# Patient Record
Sex: Male | Born: 1970 | Race: White | Hispanic: No | Smoking: Former smoker
Health system: Southern US, Community
[De-identification: ages and names within clinical notes are randomized; demographics above are authoritative.]

## PROBLEM LIST (undated history)

## (undated) DIAGNOSIS — K449 Diaphragmatic hernia without obstruction or gangrene: Secondary | ICD-10-CM

## (undated) DIAGNOSIS — Z860100 Personal history of colon polyps, unspecified: Secondary | ICD-10-CM

## (undated) DIAGNOSIS — K5790 Diverticulosis of intestine, part unspecified, without perforation or abscess without bleeding: Secondary | ICD-10-CM

## (undated) DIAGNOSIS — D509 Iron deficiency anemia, unspecified: Secondary | ICD-10-CM

## (undated) DIAGNOSIS — I10 Essential (primary) hypertension: Secondary | ICD-10-CM

## (undated) DIAGNOSIS — A048 Other specified bacterial intestinal infections: Secondary | ICD-10-CM

## (undated) DIAGNOSIS — I8390 Asymptomatic varicose veins of unspecified lower extremity: Secondary | ICD-10-CM

## (undated) DIAGNOSIS — D126 Benign neoplasm of colon, unspecified: Secondary | ICD-10-CM

## (undated) HISTORY — DX: Other specified bacterial intestinal infections: A04.8

## (undated) HISTORY — DX: Iron deficiency anemia, unspecified: D50.9

## (undated) HISTORY — DX: Diverticulosis of intestine, part unspecified, without perforation or abscess without bleeding: K57.90

## (undated) HISTORY — DX: Benign neoplasm of colon, unspecified: D12.6

## (undated) HISTORY — DX: Personal history of colonic polyps: Z86.010

## (undated) HISTORY — DX: Diaphragmatic hernia without obstruction or gangrene: K44.9

## (undated) HISTORY — PX: VASECTOMY: SHX75

## (undated) HISTORY — DX: Personal history of colon polyps, unspecified: Z86.0100

## (undated) HISTORY — DX: Asymptomatic varicose veins of unspecified lower extremity: I83.90

---

## 2002-11-26 ENCOUNTER — Emergency Department (HOSPITAL_COMMUNITY): Admission: EM | Admit: 2002-11-26 | Discharge: 2002-11-26 | Payer: Self-pay | Admitting: Emergency Medicine

## 2003-04-27 ENCOUNTER — Encounter: Payer: Self-pay | Admitting: Emergency Medicine

## 2003-04-27 ENCOUNTER — Emergency Department (HOSPITAL_COMMUNITY): Admission: EM | Admit: 2003-04-27 | Discharge: 2003-04-28 | Payer: Self-pay | Admitting: Emergency Medicine

## 2003-10-06 ENCOUNTER — Observation Stay (HOSPITAL_COMMUNITY): Admission: EM | Admit: 2003-10-06 | Discharge: 2003-10-07 | Payer: Self-pay | Admitting: Emergency Medicine

## 2003-12-11 ENCOUNTER — Emergency Department (HOSPITAL_COMMUNITY): Admission: AD | Admit: 2003-12-11 | Discharge: 2003-12-11 | Payer: Self-pay | Admitting: Emergency Medicine

## 2006-10-09 ENCOUNTER — Inpatient Hospital Stay (HOSPITAL_COMMUNITY): Admission: EM | Admit: 2006-10-09 | Discharge: 2006-10-11 | Payer: Self-pay | Admitting: Emergency Medicine

## 2010-10-03 ENCOUNTER — Emergency Department (HOSPITAL_BASED_OUTPATIENT_CLINIC_OR_DEPARTMENT_OTHER)
Admission: EM | Admit: 2010-10-03 | Discharge: 2010-10-03 | Payer: Self-pay | Source: Home / Self Care | Admitting: Emergency Medicine

## 2010-10-06 ENCOUNTER — Ambulatory Visit: Payer: Self-pay | Admitting: Internal Medicine

## 2010-10-06 DIAGNOSIS — I8000 Phlebitis and thrombophlebitis of superficial vessels of unspecified lower extremity: Secondary | ICD-10-CM | POA: Insufficient documentation

## 2010-10-06 DIAGNOSIS — I10 Essential (primary) hypertension: Secondary | ICD-10-CM | POA: Insufficient documentation

## 2010-10-06 LAB — CONVERTED CEMR LAB

## 2010-10-11 ENCOUNTER — Ambulatory Visit: Payer: Self-pay | Admitting: Vascular Surgery

## 2010-10-11 ENCOUNTER — Encounter: Payer: Self-pay | Admitting: Internal Medicine

## 2010-11-07 ENCOUNTER — Ambulatory Visit: Admit: 2010-11-07 | Payer: Self-pay | Admitting: Internal Medicine

## 2010-11-18 ENCOUNTER — Encounter: Payer: Self-pay | Admitting: Internal Medicine

## 2010-11-30 NOTE — Assessment & Plan Note (Signed)
Summary: new [facility] referred by Dr Jeanell Sparrow has superficial thrombo...   Vital Signs:  Patient profile:   40 year old male Height:      72.5 inches Weight:      226.75 pounds BMI:     30.44 O2 Sat:      98 % on Room air Temp:     98.3 degrees F oral Pulse rate:   90 / minute Resp:     18 per minute BP sitting:   140 / 90  (right arm) Cuff size:   large  Vitals Entered By: Jiles Garter CMA (October 06, 2010 2:39 PM)  O2 Flow:  Room air CC: New Patient  Comments dicuss left leg pain, varicose veins stripped on Tuesday   Primary Care Jemya Depierro:  Jennings Books DO  CC:  New Patient .  History of Present Illness: 40 y/o white male for ER follow up prev followed Dr. Conley Canal  seen in ER by Dr. Jeanell Sparrow for superficial thrombophlebitis treated Narda Amber vein for varicose veins 2 yrs ago - vein stripping done by Dr. Eilleen Kempf left leg recently became red and swollen  ibuprofen 600 mg - 800 mg prescribed leg is not feeling better still having pain and tenderness of left upper leg  venous doppler completed in ER 12/6 - negative for DVT superficial greater saphenouv vein thrombosis form distal knee to mid thigh  Preventive Screening-Counseling & Management  Alcohol-Tobacco     Alcohol drinks/day: <1     Smoking Status: current     Packs/Day: <0.25     Year Started: 2004  Caffeine-Diet-Exercise     Caffeine use/day: 1 beverage daily     Does Patient Exercise: no      Drug Use:  no.    Allergies (verified): No Known Drug Allergies  Past History:  Past Medical History: Hypertension  Family History: Father had CVA - age 3  Family History Hypertension DM II - mother no colon ca no prostate ca  Social History: Occupation: Interior and spatial designer - [employer] Divorced 2 children - 32 and 31 (shares custody) social smoker Alcohol use-yes Drug use-no Smoking Status:  current Packs/Day:  <0.25 Caffeine use/day:  1 beverage daily Does Patient Exercise:   no Drug Use:  no  Review of Systems  The patient denies fever, chest pain, dyspnea on exertion, prolonged cough, abdominal pain, melena, hematochezia, and severe indigestion/heartburn.         hx of depression  ( lexapro caused agitation )  Physical Exam  General:  alert, well-developed, and well-nourished.   Head:  normocephalic and atraumatic.   Eyes:  pupils equal, pupils round, and pupils reactive to light.   Ears:  R ear normal and L ear normal.   Mouth:  good dentition and pharynx pink and moist.   Neck:  No deformities, masses, or tenderness noted.no carotid bruits.   Lungs:  normal respiratory effort, normal breath sounds, no crackles, and no wheezes.   Heart:  normal rate, regular rhythm, no murmur, and no gallop.   Abdomen:  soft, non-tender, normal bowel sounds, no masses, no hepatomegaly, and no splenomegaly.   Extremities:  trace left pedal edema and trace right pedal edema.   Skin:  tenderness and swelling along left superficial fem vein.  palpable cord Psych:  normally interactive, good eye contact, not anxious appearing, and not depressed appearing.     Impression & Recommendations:  Problem # 1:  SUPERFICIAL THROMBOPHLEBITIS (ICD-451.0) Assessment New pt with  painful superficial thrombophlebitis continue warm compress and aspirin use compression hose arrange follow up with vascular clinic  if symptoms do not improve, consider LMW heparin Orders: T-Basic Metabolic Panel ([id number]) T-Hepatic Function [phone]) T-Lipid Profile [id number]) T-CBC No Diff ([id number]) T-TSH ([id number]) T-Protime, Auto (75301-04045) Vascular Clinic (Vascular)  Problem # 2:  HYPERTENSION (ICD-401.9) previously on micardis.  he stopped medication on his own 3-4 yrs ago restart ARB  His updated medication list for this problem includes:    Losartan Potassium 50 Mg Tabs (Losartan potassium) ..... One by mouth once daily  Orders: T-Basic Metabolic Panel  ([id number]) T-Hepatic Function [id number]) T-Lipid Profile [id number]) T-CBC No Diff ([id number]) T-TSH ([id number]) T-Protime, Auto (25500-16429)  Complete Medication List: 1)  Losartan Potassium 50 Mg Tabs (Losartan potassium) .... One by mouth once daily 2)  Ecotrin 325 Mg Tbec (Aspirin) .... One by mouth once daily  Patient Instructions: 1)  Please schedule a follow-up appointment in 1 month. Prescriptions: LOSARTAN POTASSIUM 50 MG TABS (LOSARTAN POTASSIUM) one by mouth once daily  #30 x 2   Entered and Authorized by:   D. Drema Pry DO   Signed by:   D. Drema Pry DO on 10/06/2010   Method used:   Print then Give to Patient   RxID:   [id number]    Orders Added: 1)  T-Basic Metabolic Panel [[id number]] 2)  T-Hepatic Function [[id number]] 3)  T-Lipid Profile [[id number]] 4)  T-CBC No Diff [[id number]] 5)  T-TSH [30746-00298] 6)  T-Protime, Auto [47308-56943] 7)  Vascular Clinic [Vascular] 8)  New Patient Level III [99203]   Immunization History:  Influenza Immunization History:    Influenza:  declined (10/06/2010)   Contraindications/Deferment of Procedures/Staging:    Test/Procedure: PSA    Reason for deferment: patient declined   Immunization History:  Influenza Immunization History:    Influenza:  Declined (10/06/2010)   Orders Added: 1)  T-Basic Metabolic Panel [[id number]] 2)  T-Hepatic Function [[id number]] 3)  T-Lipid Profile [[id number]] 4)  T-CBC No Diff [85027-10000] 5)  T-TSH [43014-84039] 6)  T-Protime, Auto [79536-92230] 7)  Vascular Clinic [Vascular] 8)  New Patient Level III [09794]   Current Allergies (reviewed today): No known allergies

## 2010-11-30 NOTE — Consult Note (Signed)
Summary: [facility]  [facility] [place]   Imported By: Edmonia James 10/20/2010 09:00:44  _____________________________________________________________________  External Attachment:    Type:   Image     Comment:   External Document

## 2011-03-13 NOTE — Consult Note (Signed)
NEW PATIENT CONSULTATION   [name], Martin L  DOB:  [birth year]                                       10/11/2010  EUMPN#:[id number]   The patient presents today for evaluation of superficial  thrombophlebitis in his left thigh.  He is an otherwise healthy 40-year-  old gentleman who had a prior treatment at an outlying vein center.  On  discussing with the patient, it sounds as though he had an ablation of  the anterior saphenous branch in his left thigh.  He 2 weeks ago had an  episode of pain and erythema over this area and was seen at an outlying  ED and was appropriately diagnosed with superficial thrombophlebitis.  He had an ultrasound at that time showing no evidence of DVT.  He is  seeing me for further evaluation.  He does not have any other history of  clotting disorder.   PHYSICAL EXAMINATION:  Well-developed, well-nourished white male  appearing stated age, in no acute distress.  Blood pressure is 150/118,  heart rate is 80, respirations 20.  He has 2+ dorsalis pedis  bilaterally.  He has extensive clot by physical exam beginning in his  proximal thigh extending up over his anterior thigh towards the  saphenofemoral junction.  I imaged this with ultrasound, and this does  show clot from the proximal calf through this anterior branch all the  way to just below the saphenofemoral junction.  He does have a small  caliber saphenous vein, with this anterior branch coming close to the  fascia.   I discussed this with the patient.  I explained that this does not have  put him at any increased risk for DVT.  I suspect that he did have  recanalization of this anterior branch which was in all likelihood  treated with laser ablation and had recanalization.  I explained that as  he resolves this clot that he may sclerose this vein and not require any  additional treatment.  I also explained that when he resolves the clot  in this surface vein that he may have a  recurrent large varices that may  require additional treatment, either ablation or phlebectomy.  He will  continue his ibuprofen, elevation and heat and will see [place] again in  several months he has recurrent problems.  Otherwise he will see [place] on  an as-needed basis.     Rosetta Posner, M.D.  Electronically Signed   TFE/MEDQ  D:  10/11/2010  T:  10/12/2010  Job:  [id number]   cc:   Sandy Salaam. Shawna Orleans, DO
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
## 2011-03-16 NOTE — Procedures (Signed)
EEG NUMBER:  [id number]   HISTORY:  This is a 40 year old with excessive sleepiness, lethargy and  disorientation and is having an EEG done to evaluate for seizure  activity.   PROCEDURE:  This is a routine EEG technical description.  Throughout  this routine EEG, there is a posterior dominant rhythm of 9-10 Hz  activity at 10-20 microvolts of background activity, symmetric, mostly  comprised of alpha and theta range activity and 10-30 microvolts.  The  patient is extremely drowsy throughout the beginning of the tracing and  eventually falls asleep during the recording.  There is occasional EMG  movement artifact that occasionally obscures the background.  With  photic stimulation, there is symmetric photic driving response.  Hyperventilation does not produce any significant abnormalities.  Throughout this record, there is no evidence of electrographic seizures  roll or interictal discharge activity.   IMPRESSION:  This routine EEG is within normal limits in the awake and  sleep states.      Martin [name]. Estella Husk, M.D.  Electronically Signed     LTK:CXWN  D:  10/10/2006 14:22:44  T:  10/10/2006 21:57:26  Job #:  [id number]
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
## 2011-03-16 NOTE — Discharge Summary (Signed)
NAMENAVY, BELAY NO.:  [id number]   MEDICAL RECORD NO.:  [id number]          PATIENT TYPE:  INP   LOCATION:  7494                         FACILITY:  [facility]   PHYSICIAN:  Cyril Mourning, D.O.    DATE OF BIRTH:  04/19/1971   DATE OF ADMISSION:  10/09/2006  DATE OF DISCHARGE:  10/11/2006                               DISCHARGE SUMMARY   PRIMARY CARE PHYSICIAN:  Dr. Harley Alto.   FINAL DIAGNOSIS:  Vertigo.   ADDITIONAL DIAGNOSIS:  Elevated LDL of 127.   CONSULTATIONS THIS HOSPITALIZATION:  Dr. Morene Antu of neurology.   PROCEDURES PERFORMED THIS ADMISSION:  MRI of the brain, as well as MRA  of the neck.  No significant abnormality had bee detected, though the  images were motion degraded.  There was only some mild narrowing and  irregularity in the proximal right vertebral artery.  The left vertebral  artery is a dominant vertebral artery.  There was no evidence of  hemodynamically significant stenosis involving either carotid  bifurcation.  His head CT revealed.  His EEG findings were normal.  Lab  analysis revealed normal homocystine.  Lipid profile revealed an LDL of  127.  TSH was low at 0.337 and is recommended to have this repeated in  the outpatient setting.  Hemoglobin A1c is 5.3.  BUN is 5, creatinine is  0.8.   MEDICATIONS ON DISCHARGE:  No prescriptions were provided.   He was instructed to follow up with Dr. Conley Canal in one to two weeks and  he was instructed to undergo outpatient physical therapy for vestibular  rehabilitation, as well as undergo a sleep study through a set primary  care physician for daytime hypersomnolence.   HISTORY OF PRESENT ILLNESS:  For full details, please refer to the H&P,  as dictated by Dr. Maia Petties, however briefly, Mr. Hersh is a 40 year old  male with a history of depression and hypertension who was in his usual  state of health until around 8:30 a.m. when his girlfriend went to visit  and found him  confused and difficult arouse.  He was not able to  comprehend the things that she had told.  He was quite lethargic.  Complained of some tingling all over.  The patient stated that he felt  as though his tongue was swollen.  His girlfriend called EMS.  He was a  little tachycardiac on EMS arrival.  His blood glucose was 139.  In the  event underwent neurology consultation in the emergency department and  was admitted for further workup.   HOSPITAL COURSE:  Mr. Boorman's course was unremarkable.  His symptoms  did resolve.  There were no acute findings on the workup and EEG, as  well as MRI and MRA were normal.  His LDL was slightly elevated.  We  employed the assistance of physical therapy for vestibular training and  this is being recommended to continue in the outpatient setting.      Cyril Mourning, D.O.  Electronically Signed     ESS/MEDQ  D:  12/16/2006  T:  12/17/2006  Job:  [id number]   cc:   Theodoro Parma. Francina Ames., M.D.
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
## 2011-07-01 ENCOUNTER — Encounter: Payer: Self-pay | Admitting: *Deleted

## 2011-07-01 ENCOUNTER — Emergency Department (INDEPENDENT_AMBULATORY_CARE_PROVIDER_SITE_OTHER): Payer: Self-pay

## 2011-07-01 ENCOUNTER — Emergency Department (HOSPITAL_BASED_OUTPATIENT_CLINIC_OR_DEPARTMENT_OTHER)
Admission: EM | Admit: 2011-07-01 | Discharge: 2011-07-01 | Disposition: A | Discharge status: Home / Self Care | Payer: Self-pay | Attending: Emergency Medicine | Admitting: Emergency Medicine

## 2011-07-01 DIAGNOSIS — W2209XA Striking against other stationary object, initial encounter: Secondary | ICD-10-CM | POA: Insufficient documentation

## 2011-07-01 DIAGNOSIS — M79609 Pain in unspecified limb: Secondary | ICD-10-CM

## 2011-07-01 DIAGNOSIS — F17200 Nicotine dependence, unspecified, uncomplicated: Secondary | ICD-10-CM | POA: Insufficient documentation

## 2011-07-01 DIAGNOSIS — S63639A Sprain of interphalangeal joint of unspecified finger, initial encounter: Secondary | ICD-10-CM | POA: Insufficient documentation

## 2011-07-01 DIAGNOSIS — M25549 Pain in joints of unspecified hand: Secondary | ICD-10-CM

## 2011-07-01 DIAGNOSIS — R609 Edema, unspecified: Secondary | ICD-10-CM
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
## 2011-07-02 NOTE — ED Provider Notes (Signed)
Medical screening examination/treatment/procedure(s) were performed by non-physician practitioner and as supervising physician I was immediately available for consultation/collaboration.  Babette Relic, MD 07/02/11 1328
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
## 2013-02-07 ENCOUNTER — Encounter (HOSPITAL_BASED_OUTPATIENT_CLINIC_OR_DEPARTMENT_OTHER): Payer: Self-pay

## 2013-02-07 ENCOUNTER — Emergency Department (HOSPITAL_BASED_OUTPATIENT_CLINIC_OR_DEPARTMENT_OTHER): Payer: BC Managed Care – PPO

## 2013-02-07 ENCOUNTER — Emergency Department (HOSPITAL_BASED_OUTPATIENT_CLINIC_OR_DEPARTMENT_OTHER)
Admission: EM | Admit: 2013-02-07 | Discharge: 2013-02-07 | Disposition: A | Discharge status: Home / Self Care | Payer: BC Managed Care – PPO | Attending: Emergency Medicine | Admitting: Emergency Medicine

## 2013-02-07 DIAGNOSIS — F17200 Nicotine dependence, unspecified, uncomplicated: Secondary | ICD-10-CM | POA: Insufficient documentation

## 2013-02-07 DIAGNOSIS — M2540 Effusion, unspecified joint: Secondary | ICD-10-CM | POA: Insufficient documentation

## 2013-02-07 DIAGNOSIS — M5431 Sciatica, right side: Secondary | ICD-10-CM

## 2013-02-07 DIAGNOSIS — M5430 Sciatica, unspecified side: Secondary | ICD-10-CM | POA: Insufficient documentation

## 2013-02-07 DIAGNOSIS — M549 Dorsalgia, unspecified: Secondary | ICD-10-CM | POA: Insufficient documentation

## 2013-02-07 DIAGNOSIS — Z79899 Other long term (current) drug therapy: Secondary | ICD-10-CM | POA: Insufficient documentation

## 2013-02-07 MED ORDER — PREDNISONE 10 MG PO TABS: ORAL_TABLET | ORAL | Status: DC

## 2013-02-07 MED ORDER — OXYCODONE-ACETAMINOPHEN 5-325 MG PO TABS: 2.0000 | ORAL_TABLET | ORAL | Status: DC | PRN

## 2013-02-07 MED ORDER — OXYCODONE-ACETAMINOPHEN 5-325 MG PO TABS
2.0000 | ORAL_TABLET | Freq: Once | ORAL | Status: AC
  Administered 2013-02-07: 2 via ORAL
  Filled 2013-02-07 (×2): qty 2

## 2013-02-07 NOTE — ED Provider Notes (Signed)
Medical screening examination/treatment/procedure(s) were performed by non-physician practitioner and as supervising physician I was immediately available for consultation/collaboration.   Saddie Benders. Letishia Elliott, MD 02/07/13 1539

## 2013-02-07 NOTE — ED Notes (Signed)
Pt states that he had osnet of L hip pain last night, denies any n/v, fever, urinary symptoms.  Pain incr with movement.  Pt states that pain is in the L buttock and radiating down the the L lateral thigh.

## 2013-02-07 NOTE — ED Provider Notes (Signed)
History     CSN: [id number]  Arrival date & time 02/07/13  61   First MD Initiated Contact with Patient 02/07/13 1349      Chief Complaint  Patient presents with  . Hip Pain    (Consider location/radiation/quality/duration/timing/severity/associated sxs/prior treatment) Patient is a 42 y.o. male presenting with hip pain. The history is provided by the patient. No language interpreter was used.  Hip Pain This is a new problem. The current episode started yesterday. The problem occurs constantly. The problem has been gradually worsening. Associated symptoms include joint swelling. Nothing aggravates the symptoms. He has tried nothing for the symptoms.  Pt reports sudden severe pain  Left hip and back.   Pt reports pain with walking,  Pain with moving hip and back.   Pt has a history of a ruptured disc l5-s1  History reviewed. No pertinent past medical history.  Past Surgical History  Procedure Laterality Date  . Vasectomy      History reviewed. No pertinent family history.  History  Substance Use Topics  . Smoking status: Current Every Day Smoker -- 0.50 packs/day for 4 years    Types: Cigarettes  . Smokeless tobacco: Never Used  . Alcohol Use: 8.4 oz/week    14 Glasses of wine per week      Review of Systems  Musculoskeletal: Positive for back pain and joint swelling.  All other systems reviewed and are negative.    Allergies  Review of patient's allergies indicates no known allergies.  Home Medications   Current Outpatient Rx  Name  Route  Sig  Dispense  Refill  . acetaminophen (TYLENOL) 500 MG tablet   Oral   Take 1,000 mg by mouth every 6 (six) hours as needed for pain.         . methocarbamol (ROBAXIN) 500 MG tablet   Oral   Take 500 mg by mouth once.         Marland Kitchen UNKNOWN TO PATIENT      Pt took a family members narcotic pain medication this morning.  One tablet only.         . Tetrahydrozoline HCl (VISINE OP)   Both Eyes   Place 1 drop into  both eyes daily as needed. Dry eyes          . vitamin B-12 (CYANOCOBALAMIN) 1000 MCG tablet   Oral   Take 1,000 mcg by mouth daily.             BP 144/95  Pulse 95  Temp(Src) 99 F (37.2 C) (Oral)  Resp 20  Ht 6' 2"  (1.88 m)  Wt 229 lb (103.874 kg)  BMI 29.39 kg/m2  SpO2 98%  Physical Exam  Nursing note and vitals reviewed. Constitutional: He is oriented to person, place, and time. He appears well-developed.  Cardiovascular: Normal rate.   Pulmonary/Chest: Effort normal.  Abdominal: Soft.  Musculoskeletal: He exhibits tenderness.  Tender left hip,  Tender sciatic notch,   Pain with range of motion,   Dtrs equal bilat    Pain in hip with figure 4    Neurological: He is alert and oriented to person, place, and time. He has normal reflexes.    ED Course  Procedures (including critical care time)  Labs Reviewed - No data to display Dg Hip Complete Left  02/07/2013  *RADIOLOGY REPORT*  Clinical Data: Left hip pain.  LEFT HIP - COMPLETE 2+ VIEW  Comparison: None.  Findings: AP view of the pelvis and two views  of the left hip were obtained.  Calcification in the left hemipelvis is suggestive for a phlebolith.  The pelvic bony ring is intact.  Symmetric appearance of the sacroiliac joints.  Nonspecific bowel gas pattern.  Left hip is located without acute fracture.  IMPRESSION: No acute bony abnormality in the pelvis or left hip.   Original Report Authenticated By: Markus Daft, M.D.      No diagnosis found.    MDM  aso left   crutches        Fransico Meadow, [place] 02/07/13 1537

## 2013-02-10 NOTE — ED Notes (Signed)
Pt called and requested a note for 4/14 -16/2014 for work.

## 2013-09-25 IMAGING — CR DG HIP (WITH OR WITHOUT PELVIS) 2-3V*L*
3 series · 3 of 3 positions shown · non-contrast
Comparison: None.

CLINICAL DATA: Left hip pain.

LEFT HIP - COMPLETE 2+ VIEW

[t pelvis a.p.]
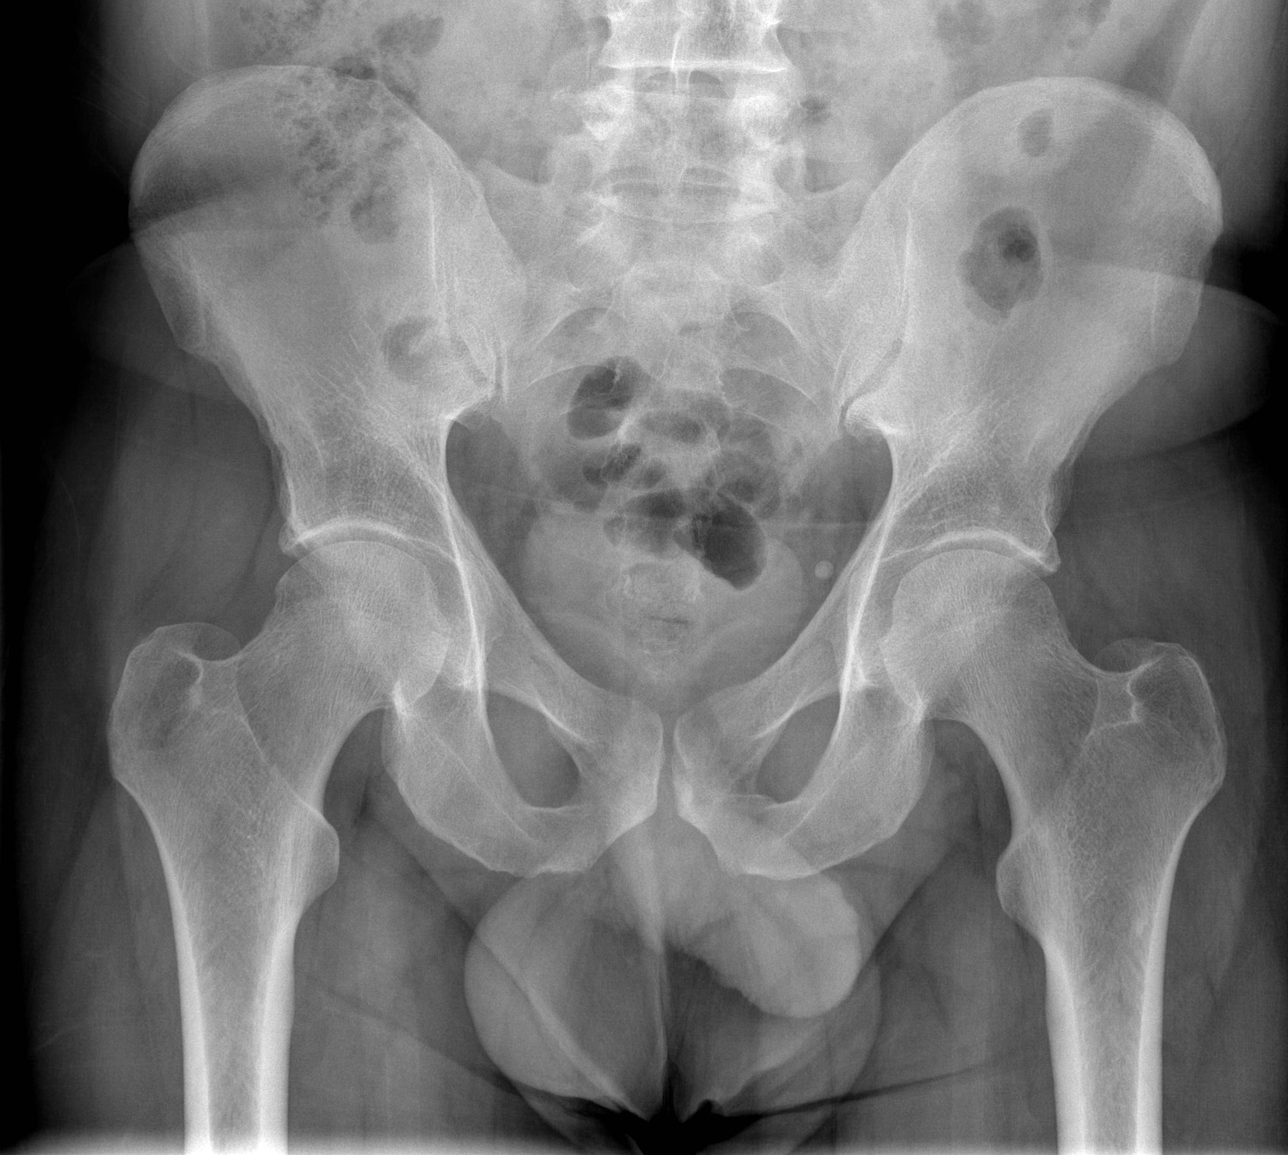

[t hip ap left]
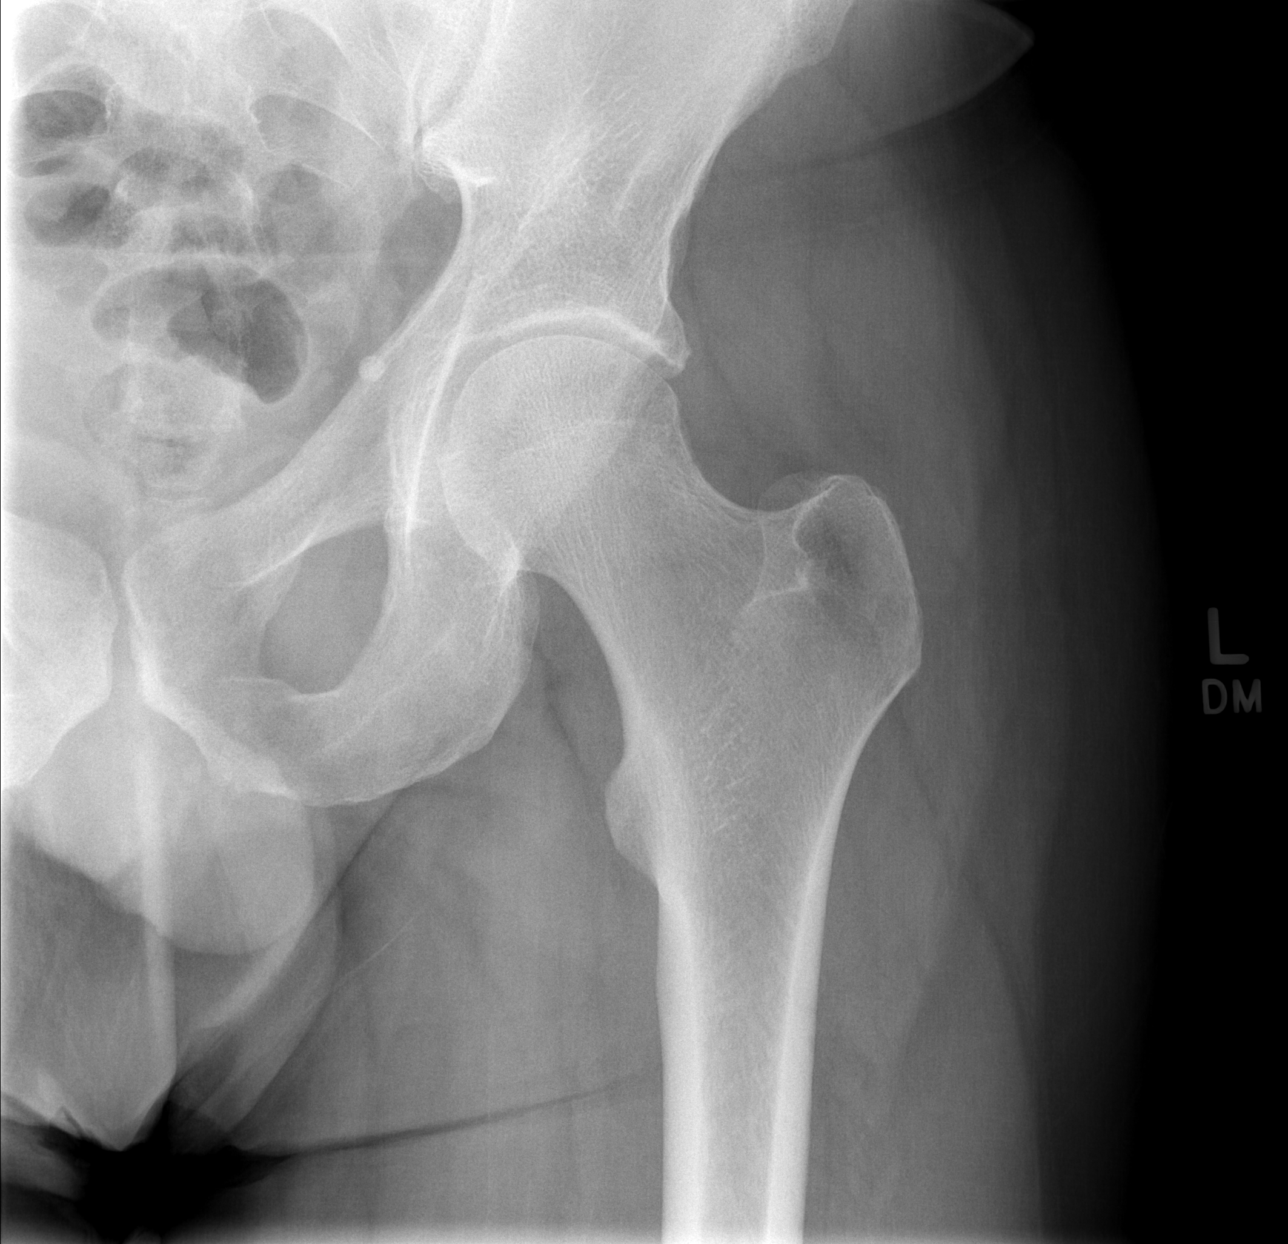

[t hip frog leg left]
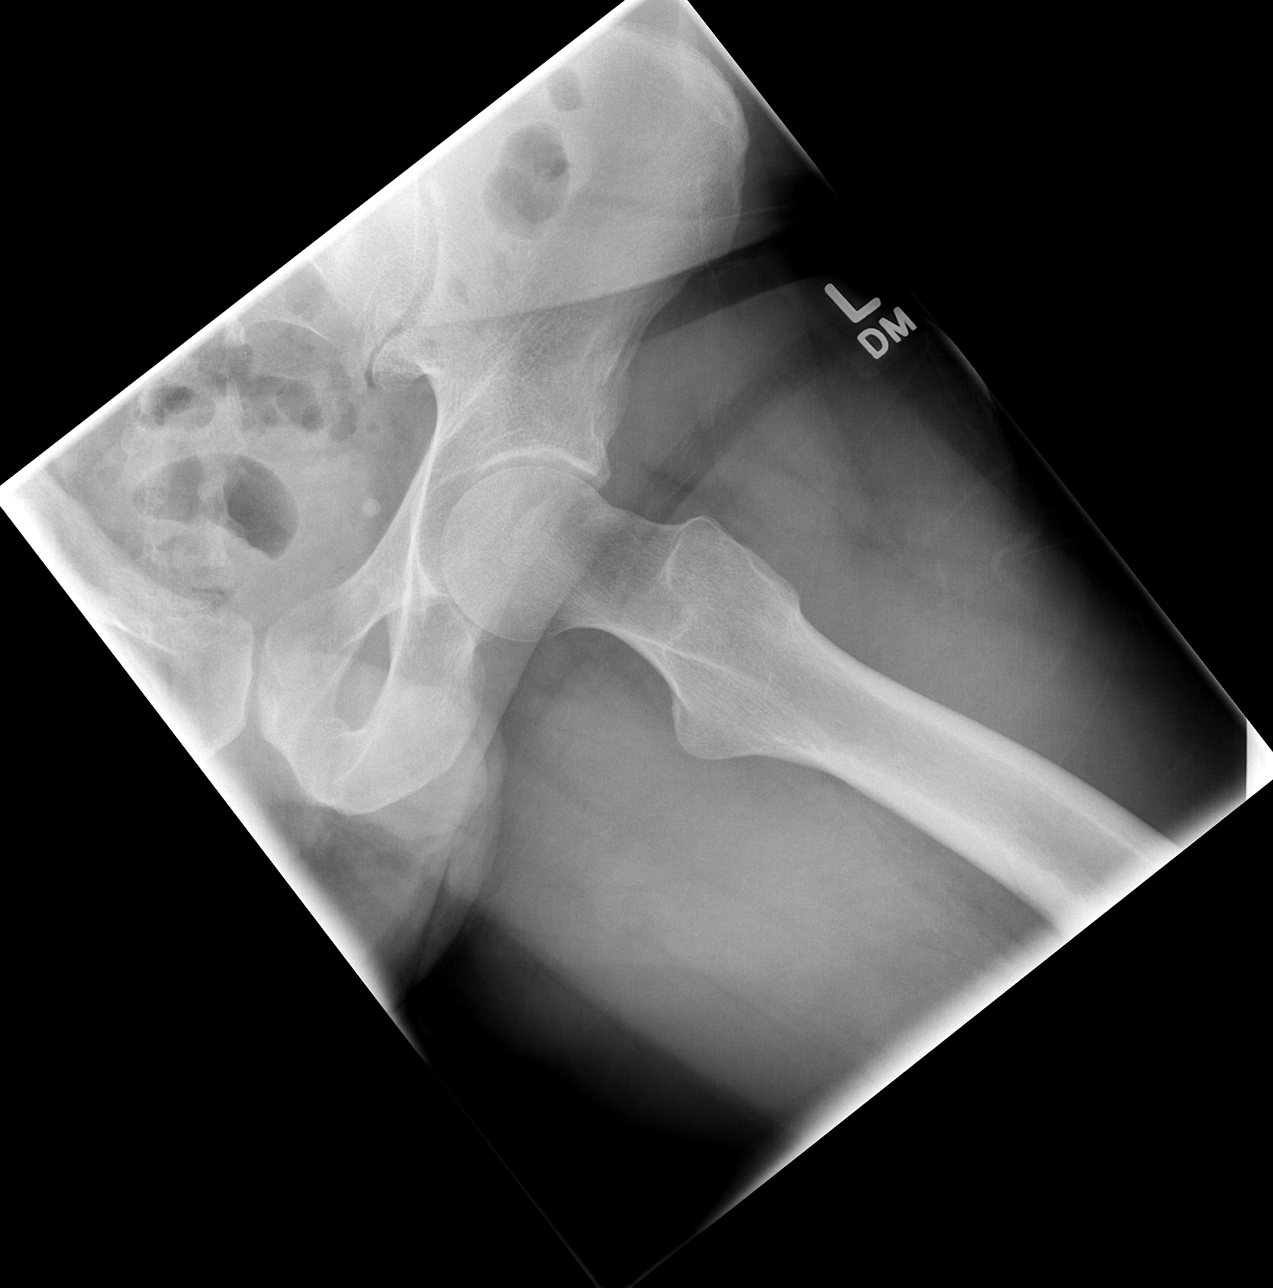

[3 of 3 positions shown; findings below may reference images not displayed]

FINDINGS: AP view of the pelvis and two views of the left hip were
obtained.  Calcification in the left hemipelvis is suggestive for a
phlebolith.  The pelvic bony ring is intact.  Symmetric appearance
of the sacroiliac joints.  Nonspecific bowel gas pattern.  Left hip
is located without acute fracture.
IMPRESSION: No acute bony abnormality in the pelvis or left hip.

## 2014-10-29 DIAGNOSIS — I8390 Asymptomatic varicose veins of unspecified lower extremity: Secondary | ICD-10-CM

## 2014-10-29 HISTORY — DX: Asymptomatic varicose veins of unspecified lower extremity: I83.90

## 2015-04-14 ENCOUNTER — Telehealth: Payer: Self-pay

## 2015-04-14 DIAGNOSIS — Z8739 Personal history of other diseases of the musculoskeletal system and connective tissue: Secondary | ICD-10-CM

## 2015-04-14 DIAGNOSIS — I8390 Asymptomatic varicose veins of unspecified lower extremity: Secondary | ICD-10-CM

## 2015-04-14 DIAGNOSIS — M7989 Other specified soft tissue disorders: Secondary | ICD-10-CM

## 2015-04-14 NOTE — Telephone Encounter (Signed)
Phone call from pt.  Stated he would like an appt. To have left leg evaluated.  C/o swelling of left ankle and shin area.  Reported there is a burning sensation in the left groin and upper thigh, and in the (L) calf and inner ankle.  Also c/o bulging varicose veins in (L) upper thigh / groin area, and (L) calf.  C/o tingling and numbness from knee and down the lower leg.  Denied any open sores.  Reported he had a laser procedure done on the left leg, at the knee, at another outpatient vein center in the past, and does not want to go back there. Advised will call pt. back to schedule an appt.

## 2015-04-15 ENCOUNTER — Telehealth: Payer: Self-pay | Admitting: Vascular Surgery

## 2015-04-15 NOTE — Telephone Encounter (Signed)
notified patient of appt. with dr. Scot Dock on 04-29-15 at 11:30 for lab and then 12-:30 to see md

## 2015-04-27 ENCOUNTER — Encounter: Payer: Self-pay | Admitting: Vascular Surgery

## 2015-04-29 ENCOUNTER — Ambulatory Visit (HOSPITAL_COMMUNITY)
Admission: RE | Admit: 2015-04-29 | Discharge: 2015-04-29 | Disposition: A | Discharge status: Home / Self Care | Payer: BLUE CROSS/BLUE SHIELD | Source: Ambulatory Visit | Attending: Vascular Surgery | Admitting: Vascular Surgery

## 2015-04-29 ENCOUNTER — Ambulatory Visit (INDEPENDENT_AMBULATORY_CARE_PROVIDER_SITE_OTHER): Payer: BLUE CROSS/BLUE SHIELD | Admitting: Vascular Surgery

## 2015-04-29 ENCOUNTER — Encounter: Payer: Self-pay | Admitting: Vascular Surgery

## 2015-04-29 VITALS — BP 134/88 | HR 66 | Resp 16 | Ht 74.0 in | Wt 239.0 lb

## 2015-04-29 DIAGNOSIS — M7989 Other specified soft tissue disorders: Secondary | ICD-10-CM | POA: Diagnosis not present

## 2015-04-29 DIAGNOSIS — Z8739 Personal history of other diseases of the musculoskeletal system and connective tissue: Secondary | ICD-10-CM

## 2015-04-29 DIAGNOSIS — I868 Varicose veins of other specified sites: Secondary | ICD-10-CM | POA: Insufficient documentation

## 2015-04-29 DIAGNOSIS — I8390 Asymptomatic varicose veins of unspecified lower extremity: Secondary | ICD-10-CM

## 2015-04-29 DIAGNOSIS — I872 Venous insufficiency (chronic) (peripheral): Secondary | ICD-10-CM

## 2015-04-29 DIAGNOSIS — Z87898 Personal history of other specified conditions: Secondary | ICD-10-CM | POA: Insufficient documentation

## 2015-04-29 NOTE — Progress Notes (Signed)
[facility] [place]  Patient name: Martin [name] MRN: [id number] DOB: Apr 21, 1971 Sex: male  REASON FOR VISIT: Left leg pain and swelling with a history of chronic venous insufficiency.  HPI: Martin [name] is a 44 y.o. male who underwent laser ablation of the left greater saphenous vein by [place] [facility] in 2010. He was seen in our office by Dr. Donnetta Hutching in December 2011. He has a history of varicose veins of the left lower extremity and over the last several months these have become significantly symptomatic. He experiences significant aching pain and swelling in the left leg. The symptoms are aggravated by standing and relieved somewhat with elevation. He denies any previous history of DVT or phlebitis. He works at [employer] and is walking and moving around most of the day but is on his feet for fairly long days.  His medical history is otherwise fairly unremarkable. He denies any history of diabetes, hypertension, hypercholesterolemia, or history of cardiac disease.   Past Medical History  Diagnosis Date  . Varicose veins 2016    Left Leg   Family History  Problem Relation Age of Onset  . Diabetes Mother   . COPD Father   . Diabetes Father   . Hypertension Father   . Hypertension Brother    SOCIAL HISTORY: History  Substance Use Topics  . Smoking status: Former Smoker -- 0.50 packs/day for 4 years    Types: Cigarettes    Quit date: 04/28/2012  . Smokeless tobacco: Never Used  . Alcohol Use: 8.4 oz/week    14 Glasses of wine per week   No Known Allergies Current Outpatient Prescriptions  Medication Sig Dispense Refill  . acetaminophen (TYLENOL) 500 MG tablet Take 1,000 mg by mouth every 6 (six) hours as needed for pain.    . methocarbamol (ROBAXIN) 500 MG tablet Take 500 mg by mouth once.    Marland Kitchen oxyCODONE-acetaminophen (PERCOCET/ROXICET) 5-325 MG per tablet Take 2 tablets by mouth every 4 (four) hours as needed for pain. (Patient taking  differently: Take 2 tablets by mouth as needed. Back) 15 tablet 0  . Tetrahydrozoline HCl (VISINE OP) Place 1 drop into both eyes daily as needed. Dry eyes     . UNKNOWN TO PATIENT Pt took a family members narcotic pain medication this morning.  One tablet only.    . vitamin B-12 (CYANOCOBALAMIN) 1000 MCG tablet Take 1,000 mcg by mouth daily.      . predniSONE (DELTASONE) 10 MG tablet 6,5,4,3,2,1 taper (Patient not taking: Reported on 04/29/2015) 21 tablet 0   No current facility-administered medications for this visit.   REVIEW OF SYSTEMS: Valu.Nieves ] denotes positive finding; [  ] denotes negative finding  CARDIOVASCULAR:  [ ]  chest pain   [ ]  chest pressure   [ ]  palpitations   [ ]  orthopnea   [ ]  dyspnea on exertion   [ ]  claudication   [ ]  rest pain   [ ]  DVT   [ ]  phlebitis PULMONARY:   [ ]  productive cough   [ ]  asthma   [ ]  wheezing NEUROLOGIC:   [ ]  weakness  Valu.Nieves ] paresthesias left leg [ ]  aphasia  [ ]  amaurosis  [ ]  dizziness HEMATOLOGIC:   [ ]  bleeding problems   [ ]  clotting disorders MUSCULOSKELETAL:  [ ]  joint pain   [ ]  joint swelling Valu.Nieves ] leg swelling GASTROINTESTINAL: [ ]   blood in stool  [ ]   hematemesis GENITOURINARY:  [ ]   dysuria  [ ]   hematuria PSYCHIATRIC:  [ ]  history of major depression INTEGUMENTARY:  [ ]  rashes  [ ]  ulcers CONSTITUTIONAL:  [ ]  fever   [ ]  chills  PHYSICAL EXAM: Filed Vitals:   04/29/15 1220  BP: 134/88  Pulse: 66  Resp: 16  Height: 6' 2"  (1.88 m)  Weight: 239 lb (108.41 kg)  SpO2: 94%   GENERAL: The patient is a well-nourished male, in no acute distress. The vital signs are documented above. CARDIOVASCULAR: There is a regular rate and rhythm. I do not detect carotid bruits. He has palpable dorsalis pedis pulses. PULMONARY: There is good air exchange bilaterally without wheezing or rales. ABDOMEN: Soft and non-tender with normal pitched bowel sounds.  MUSCULOSKELETAL: There are no major deformities or cyanosis. NEUROLOGIC: No focal weakness or  paresthesias are detected. SKIN: There are no ulcers or rashes noted. He has some large truncal varicosities along the anterior medial aspect of his left thigh and also the medial aspect of his left calf. PSYCHIATRIC: The patient has a normal affect.  DATA:  I have independently interpreted his venous duplex of the left lower extremity. There is no evidence of DVT in the left lower extremity. There is reflux in the deep system involving the common femoral vein and popliteal vein. There is an accessory branch of the left great saphenous vein that is dilated and tortuous.  There is also a short segment of reflux in the small saphenous vein in the left calf.  MEDICAL ISSUES:  CHRONIC VENOUS INSUFFICIENCY: this patient has significant symptoms from his varicose veins of the left lower extremity. He has significant aching and swelling which has come progressively worse over the last several months. He now has some paresthesias in the left foot. The accessory branch of the left great saphenous vein is very tortuous in this might potentially make it challenging to consider laser ablation of this vein. However we will review the study with Dr. Donnetta Hutching. In addition we have discussed the importance of intermittent leg elevation and proper positioning for this. I also written him a prescription for knee-high compression stockings with a gradient of 15-20 mmHg. He might need to be considered for 5 stockings with a 20-30 mmHg gradient although today he was hasn't to consider thigh-high stockings. I've encouraged him to avoid prolonged sitting and standing. I have encouraged him to exercise as much as possible and to consider water aerobics. We will see him back prn.  Deitra Mayo [facility] [place]: [phone]

## 2017-10-16 ENCOUNTER — Encounter: Payer: Self-pay | Admitting: Gastroenterology

## 2017-11-06 ENCOUNTER — Encounter (INDEPENDENT_AMBULATORY_CARE_PROVIDER_SITE_OTHER): Payer: Self-pay

## 2017-11-06 ENCOUNTER — Ambulatory Visit: Payer: Managed Care, Other (non HMO) | Admitting: Gastroenterology

## 2017-11-06 ENCOUNTER — Encounter: Payer: Self-pay | Admitting: Gastroenterology

## 2017-11-06 VITALS — BP 176/118 | HR 83 | Ht 74.0 in | Wt 251.0 lb

## 2017-11-06 DIAGNOSIS — K625 Hemorrhage of anus and rectum: Secondary | ICD-10-CM | POA: Insufficient documentation

## 2017-11-06 DIAGNOSIS — D509 Iron deficiency anemia, unspecified: Secondary | ICD-10-CM | POA: Diagnosis not present

## 2017-11-06 DIAGNOSIS — K219 Gastro-esophageal reflux disease without esophagitis: Secondary | ICD-10-CM | POA: Diagnosis not present

## 2017-11-06 DIAGNOSIS — R1032 Left lower quadrant pain: Secondary | ICD-10-CM

## 2017-11-06 MED ORDER — PEG-KCL-NACL-NASULF-NA ASC-C 140 G PO SOLR: 1.0000 | ORAL | 0 refills | Status: DC

## 2017-11-06 NOTE — Progress Notes (Addendum)
11/06/2017 Martin [name] [id number] 03/19/1971   HISTORY OF PRESENT ILLNESS:  This is a 47 year old male who is new to our office and is here at the request of his PCP, Dr. Maudie Mercury, for mild iron deficiency anemia.  Hemoglobin reportedly low at 11.8 g, but the labs that we have show 13.1 g.  His ferritin is low at 14, but other iron studies are within normal limits.  Vitamin B12 and folate normal.  He had an episode of rectal bleeding back in November, which prompted his visit with his PCP and these labs to be performed.  That has not recurred since that time.  He denies any major issues with moving his bowels, occasional constipation from his pain medication.  Is seeing a surgery for his back later this month.  Reports intermittent acid reflux and left upper quadrant abdominal pain, although no pain now, and reports that it is nothing severe.  Take OTC zantac a couple of times per week.  Denies NSAID use.  Apparently he had celiac labs drawn and Hemoccult cards performed, but we do not have those results and have requested them.     Past Medical History:  Diagnosis Date  . Varicose veins 2016   Left Leg   Past Surgical History:  Procedure Laterality Date  . VASECTOMY      reports that he quit smoking about 5 years ago. His smoking use included cigarettes. He has a 2.00 pack-year smoking history. he has never used smokeless tobacco. He reports that he drinks about 8.4 oz of alcohol per week. He reports that he does not use drugs. family history includes COPD in his father; Diabetes in his father and mother; Hypertension in his brother and father. No Known Allergies    Outpatient Encounter Medications as of 11/06/2017  Medication Sig  . oxyCODONE-acetaminophen (PERCOCET/ROXICET) 5-325 MG per tablet Take 2 tablets by mouth every 4 (four) hours as needed for pain. (Patient taking differently: Take 2 tablets by mouth as needed. Back)  . acetaminophen (TYLENOL) 500 MG tablet Take 1,000 mg by  mouth every 6 (six) hours as needed for pain.  Marland Kitchen PEG-KCl-NaCl-NaSulf-Na Asc-C (PLENVU) 140 g SOLR Take 1 kit by mouth as directed.  . vitamin B-12 (CYANOCOBALAMIN) 1000 MCG tablet Take 1,000 mcg by mouth daily.    . [DISCONTINUED] methocarbamol (ROBAXIN) 500 MG tablet Take 500 mg by mouth once.  . [DISCONTINUED] predniSONE (DELTASONE) 10 MG tablet 6,5,4,3,2,1 taper (Patient not taking: Reported on 04/29/2015)  . [DISCONTINUED] Tetrahydrozoline HCl (VISINE OP) Place 1 drop into both eyes daily as needed. Dry eyes   . [DISCONTINUED] UNKNOWN TO PATIENT Pt took a family members narcotic pain medication this morning.  One tablet only.   No facility-administered encounter medications on file as of 11/06/2017.      REVIEW OF SYSTEMS  : All other systems reviewed and negative except where noted in the History of Present Illness.   PHYSICAL EXAM: BP (!) 176/118   Pulse 83   Ht 6' 2"  (1.88 m)   Wt 251 lb (113.9 kg)   BMI 32.23 kg/m  General: Well developed white male in no acute distress Head: Normocephalic and atraumatic Eyes:  Sclerae anicteric, conjunctiv pink. Ears: Normal auditory acuity Neck: Supple, no masses.  Lungs: Clear throughout to auscultation Heart: Regular rate and rhythm Abdomen: Soft, non-distended. Normal bowel sounds.  Non-tender. Rectal:  Will be done at the time of colonoscopy. Musculoskeletal: Symmetrical with no gross deformities  Skin: No lesions  on visible extremities Extremities: No edema  Neurological: Alert oriented x 4, grossly non-focal Psychological:  Alert and cooperative. Normal mood and affect  ASSESSMENT AND PLAN: *47 year old male here for mild iron deficiency anemia.  Hemoglobin reportedly low at 11.8 g, but the labs that we have show 13.1 g.  His ferritin is low at 14, but other iron studies are within normal limits.  Vitamin B12 and folate normal.  He had an episode of rectal bleeding back in November, which prompted his visit and these labs to be  performed.  That has not recurred since that time.  He is within age of new screening colonoscopy guidelines.  Reports intermittent acid reflux and left upper quadrant abdominal pain, although no pain now.  Apparently he had celiac labs drawn and Hemoccult cards performed.  We do not have those results and have requested them.  We will schedule for EGD and colonoscopy to evaluate these issues as well as the iron deficiency anemia from a GI standpoint.  **The risks, benefits, and alternatives to EGD and colonoscopy were discussed with the patient and he consents to proceed.   **The patient's blood pressure was very high at his visit today on multiple rechecks.  It appears that the diastolic number was quite elevated at a recent PCPs visit, but systolic is much higher today.  I advised him that this is very dangerous and that he needs to contact his PCP today in order to be seen.  Clinically he feels fine.  CC:  Jani Gravel, MD   Addendum: Reviewed and agree with initial management. Pyrtle, Lajuan Lines, MD

## 2017-11-06 NOTE — Patient Instructions (Addendum)
Start daily iron supplement ferrous sulfate 325 mg at bedtime.   You have been scheduled for an endoscopy and colonoscopy. Please follow the written instructions given to you at your visit today. Please pick up your prep supplies at the pharmacy within the next 1-3 days. If you use inhalers (even only as needed), please bring them with you on the day of your procedure. Your physician has requested that you go to www.startemmi.com and enter the access code given to you at your visit today. This web site gives a general overview about your procedure. However, you should still follow specific instructions given to you by our office regarding your preparation for the procedure.  You may have a light breakfast the morning of prep day (the day before the procedure).   You may choose from one of the following items: eggs and toast OR chicken noodle soup and crackers.   You should have your breakfast completed between 8:00 and 9:00 am the day before your procedure.    After you have had your light breakfast you should start a clear liquid diet only, NO SOLIDS. No additional solid food is allowed. You may continue to have clear liquid up to 3 hours prior to your procedure.

## 2017-11-15 ENCOUNTER — Encounter: Payer: Self-pay | Admitting: Internal Medicine

## 2017-11-18 ENCOUNTER — Telehealth: Payer: Self-pay | Admitting: Gastroenterology

## 2017-11-18 MED ORDER — PEG-KCL-NACL-NASULF-NA ASC-C 140 G PO SOLR: 1.0000 | ORAL | 0 refills | Status: DC

## 2017-11-18 NOTE — Telephone Encounter (Signed)
Patient states pharmacy did not receive prep for colon on 1.28.19 and is requesting that it be resent to [facility]. Pt had ov on 1.9.19

## 2017-11-18 NOTE — Telephone Encounter (Signed)
Rx sent to pharmacy. Pt informed.

## 2017-11-23 ENCOUNTER — Telehealth: Payer: Self-pay | Admitting: Nurse Practitioner

## 2017-11-23 NOTE — Telephone Encounter (Signed)
Plenvu wasn't at pharmacy. Colonoscopy on Monday. Called in [place] (in case Plenvu wasn't available). Spoke with patient and advised to continue with split dose at times given to him at office visit ( I think Sunday at 6pm and Monday at [place]) for afternoon procedure.

## 2017-11-25 ENCOUNTER — Other Ambulatory Visit: Payer: Self-pay

## 2017-11-25 ENCOUNTER — Encounter: Payer: Self-pay | Admitting: Internal Medicine

## 2017-11-25 ENCOUNTER — Ambulatory Visit (AMBULATORY_SURGERY_CENTER): Payer: Managed Care, Other (non HMO) | Admitting: Internal Medicine

## 2017-11-25 VITALS — BP 136/96 | HR 75 | Temp 98.4°F | Resp 15 | Ht 74.0 in | Wt 251.0 lb

## 2017-11-25 DIAGNOSIS — D123 Benign neoplasm of transverse colon: Secondary | ICD-10-CM

## 2017-11-25 DIAGNOSIS — D122 Benign neoplasm of ascending colon: Secondary | ICD-10-CM

## 2017-11-25 DIAGNOSIS — K2970 Gastritis, unspecified, without bleeding: Secondary | ICD-10-CM

## 2017-11-25 DIAGNOSIS — K209 Esophagitis, unspecified without bleeding: Secondary | ICD-10-CM

## 2017-11-25 DIAGNOSIS — D125 Benign neoplasm of sigmoid colon: Secondary | ICD-10-CM | POA: Diagnosis not present

## 2017-11-25 DIAGNOSIS — K50918 Crohn's disease, unspecified, with other complication: Secondary | ICD-10-CM

## 2017-11-25 DIAGNOSIS — D124 Benign neoplasm of descending colon: Secondary | ICD-10-CM

## 2017-11-25 DIAGNOSIS — K635 Polyp of colon: Secondary | ICD-10-CM

## 2017-11-25 DIAGNOSIS — K2990 Gastroduodenitis, unspecified, without bleeding: Secondary | ICD-10-CM

## 2017-11-25 DIAGNOSIS — D509 Iron deficiency anemia, unspecified: Secondary | ICD-10-CM | POA: Diagnosis present

## 2017-11-25 DIAGNOSIS — B9681 Helicobacter pylori [H. pylori] as the cause of diseases classified elsewhere: Secondary | ICD-10-CM | POA: Diagnosis not present

## 2017-11-25 DIAGNOSIS — K219 Gastro-esophageal reflux disease without esophagitis: Secondary | ICD-10-CM

## 2017-11-25 DIAGNOSIS — K2950 Unspecified chronic gastritis without bleeding: Secondary | ICD-10-CM | POA: Diagnosis not present

## 2017-11-25 MED ORDER — OMEPRAZOLE 20 MG PO CPDR: 20.0000 mg | DELAYED_RELEASE_CAPSULE | Freq: Every day | ORAL | 1 refills | Status: DC

## 2017-11-25 NOTE — Op Note (Signed)
[facility] Patient Name: Martin [name] Procedure Date: 11/25/2017 2:33 PM MRN: [id number] Endoscopist: Jerene Bears , MD Age: 47 Referring MD:  Date of Birth: [birth year] Gender: Male Account #: [id number] Procedure:                Upper GI endoscopy Indications:              Iron deficiency anemia Medicines:                Monitored Anesthesia Care Procedure:                Pre-Anesthesia Assessment:                           - Prior to the procedure, a History and Physical                            was performed, and patient medications and                            allergies were reviewed. The patient's tolerance of                            previous anesthesia was also reviewed. The risks                            and benefits of the procedure and the sedation                            options and risks were discussed with the patient.                            All questions were answered, and informed consent                            was obtained. Prior Anticoagulants: The patient has                            taken no previous anticoagulant or antiplatelet                            agents. ASA Grade Assessment: II - A patient with                            mild systemic disease. After reviewing the risks                            and benefits, the patient was deemed in                            satisfactory condition to undergo the procedure.                           After obtaining informed consent, the endoscope was  passed under direct vision. Throughout the                            procedure, the patient's blood pressure, pulse, and                            oxygen saturations were monitored continuously. The                            Model GIF-HQ190 [id number]) scope was introduced                            through the mouth, and advanced to the second part                            of duodenum. The upper GI endoscopy  was                            accomplished without difficulty. The patient                            tolerated the procedure well. Scope In: Scope Out: Findings:                 LA Grade B (one or more mucosal breaks greater than                            5 mm, not extending between the tops of two mucosal                            folds) esophagitis with no bleeding was found 38 to                            40 cm from the incisors.                           A 2 cm hiatal hernia was present.                           Diffuse moderate inflammation characterized by                            erythema and granularity was found in the gastric                            fundus and in the gastric body. Biopsies were taken                            with a cold forceps for histology and Helicobacter                            pylori testing.  The examined duodenum was normal. Biopsies for                            histology were taken with a cold forceps for                            evaluation of celiac disease. Complications:            No immediate complications. Estimated Blood Loss:     Estimated blood loss was minimal. Impression:               - LA Grade B reflux esophagitis.                           - 2 cm hiatal hernia.                           - Gastritis. Biopsied.                           - Normal examined duodenum. Biopsied. Recommendation:           - Patient has a contact number available for                            emergencies. The signs and symptoms of potential                            delayed complications were discussed with the                            patient. Return to normal activities tomorrow.                            Written discharge instructions were provided to the                            patient.                           - Resume previous diet.                           - Continue present medications.                            - Await pathology results.                           - Omeprazole 20 mg is recommended once daily for                            4-8 weeks for reflux esophagitis. Jerene Bears, MD 11/25/2017 3:24:39 PM This report has been signed electronically.

## 2017-11-25 NOTE — Progress Notes (Signed)
Report to PACU, RN, vss, BBS= Clear.  

## 2017-11-25 NOTE — Progress Notes (Signed)
I have reviewed the patient's medical history in detail and updated the computerized patient record.

## 2017-11-25 NOTE — Op Note (Signed)
[facility] Patient Name: Martin [name] Procedure Date: 11/25/2017 2:33 PM MRN: [id number] Endoscopist: Jerene Bears , MD Age: 47 Referring MD:  Date of Birth: May 11, 1971 Gender: Male Account #: [id number] Procedure:                Colonoscopy Indications:              Iron deficiency anemia Medicines:                Monitored Anesthesia Care Procedure:                Pre-Anesthesia Assessment:                           - Prior to the procedure, a History and Physical                            was performed, and patient medications and                            allergies were reviewed. The patient's tolerance of                            previous anesthesia was also reviewed. The risks                            and benefits of the procedure and the sedation                            options and risks were discussed with the patient.                            All questions were answered, and informed consent                            was obtained. Prior Anticoagulants: The patient has                            taken no previous anticoagulant or antiplatelet                            agents. ASA Grade Assessment: II - A patient with                            mild systemic disease. After reviewing the risks                            and benefits, the patient was deemed in                            satisfactory condition to undergo the procedure.                           After obtaining informed consent, the colonoscope  was passed under direct vision. Throughout the                            procedure, the patient's blood pressure, pulse, and                            oxygen saturations were monitored continuously. The                            Colonoscope was introduced through the anus and                            advanced to the the terminal ileum. The colonoscopy                            was performed without difficulty. The  patient                            tolerated the procedure well. The quality of the                            bowel preparation was good. The terminal ileum,                            ileocecal valve, appendiceal orifice, and rectum                            were photographed. Scope In: 2:55:19 PM Scope Out: 3:16:56 PM Scope Withdrawal Time: 0 hours 19 minutes 22 seconds  Total Procedure Duration: 0 hours 21 minutes 37 seconds  Findings:                 The digital rectal exam was normal.                           The terminal ileum appeared normal.                           Five sessile polyps were found in the hepatic                            flexure and distal ascending colon. The polyps were                            3 to 5 mm in size. These polyps were removed with a                            cold snare. Resection and retrieval were complete.                           Two sessile polyps were found in the transverse                            colon. The polyps were 5 to 6  mm in size. These                            polyps were removed with a cold snare. Resection                            and retrieval were complete.                           A 15 mm polyp was found in the sigmoid colon. The                            polyp was sessile. The polyp was removed with a hot                            snare. Resection and retrieval were complete.                           Multiple small and large-mouthed diverticula were                            found in the sigmoid colon and descending colon.                           The retroflexed view of the distal rectum and anal                            verge was normal and showed no anal or rectal                            abnormalities. Complications:            No immediate complications. Estimated Blood Loss:     Estimated blood loss was minimal. Impression:               - The examined portion of the ileum was normal.                            - Five 3 to 5 mm polyps at the hepatic flexure and                            in the distal ascending colon, removed with a cold                            snare. Resected and retrieved.                           - Two 5 to 6 mm polyps in the transverse colon,                            removed with a cold snare. Resected and retrieved.                           - One  15 mm polyp in the sigmoid colon, removed                            with a hot snare. Resected and retrieved.                           - Moderate diverticulosis in the sigmoid colon and                            in the descending colon.                           - The distal rectum and anal verge are normal on                            retroflexion view. Recommendation:           - Patient has a contact number available for                            emergencies. The signs and symptoms of potential                            delayed complications were discussed with the                            patient. Return to normal activities tomorrow.                            Written discharge instructions were provided to the                            patient.                           - Resume previous diet.                           - Continue present medications.                           - Avoid NSAIDs for at least 2 weeks after                            polypectomy.                           - Await pathology results.                           - Repeat colonoscopy is recommended for                            surveillance. The colonoscopy date will be                            determined after  pathology results from today's                            exam become available for review.                           - Hgb and iron studies will need to be followed.                            Further recommendations after pathology from EGD                            and colonoscopy are reviewed. Jerene Bears,  MD 11/25/2017 3:31:01 PM This report has been signed electronically.

## 2017-11-25 NOTE — Progress Notes (Signed)
Called to room to assist during endoscopic procedure.  Patient ID and intended procedure confirmed with present staff. Received instructions for my participation in the procedure from the performing physician.  

## 2017-11-25 NOTE — Patient Instructions (Signed)
NEW PRESCRIPTION FOR OMEPRAZOLE SENT TO YOUR PHARMACY, THIS MEDICINE SHOULD BE TAKEN 20-30 MIN BEFORE MORNING MEAL. NO NSAIDS (MOTRIN, IBUPROFEN, ADVIL, ALEVE, NAPROXEN) FOR 2 WEEKS (FEB 11). HANDOUTS GIVEN:  GASTRITIS, ESOPHAGITIS, DIVERTICULOSIS, POLYPS    YOU HAD AN ENDOSCOPIC PROCEDURE TODAY AT [facility]:   Refer to the procedure report that was given to you for any specific questions about what was found during the examination.  If the procedure report does not answer your questions, please call your gastroenterologist to clarify.  If you requested that your care partner not be given the details of your procedure findings, then the procedure report has been included in a sealed envelope for you to review at your convenience later.  YOU SHOULD EXPECT: Some feelings of bloating in the abdomen. Passage of more gas than usual.  Walking can help get rid of the air that was put into your GI tract during the procedure and reduce the bloating. If you had a lower endoscopy (such as a colonoscopy or flexible sigmoidoscopy) you may notice spotting of blood in your stool or on the toilet paper. If you underwent a bowel prep for your procedure, you may not have a normal bowel movement for a few days.  Please Note:  You might notice some irritation and congestion in your nose or some drainage.  This is from the oxygen used during your procedure.  There is no need for concern and it should clear up in a day or so.  SYMPTOMS TO REPORT IMMEDIATELY:   Following lower endoscopy (colonoscopy or flexible sigmoidoscopy):  Excessive amounts of blood in the stool  Significant tenderness or worsening of abdominal pains  Swelling of the abdomen that is new, acute  Fever of 100F or higher   Following upper endoscopy (EGD)  Vomiting of blood or coffee ground material  New chest pain or pain under the shoulder blades  Painful or persistently difficult swallowing  New shortness of breath  Fever  of 100F or higher  Black, tarry-looking stools  For urgent or emergent issues, a gastroenterologist can be reached at any hour by calling [phone].   DIET:  We do recommend a small meal at first, but then you may proceed to your regular diet.  Drink plenty of fluids but you should avoid alcoholic beverages for 24 hours.  ACTIVITY:  You should plan to take it easy for the rest of today and you should NOT DRIVE or use heavy machinery until tomorrow (because of the sedation medicines used during the test).    FOLLOW UP: Our staff will call the number listed on your records the next business day following your procedure to check on you and address any questions or concerns that you may have regarding the information given to you following your procedure. If we do not reach you, we will leave a message.  However, if you are feeling well and you are not experiencing any problems, there is no need to return our call.  We will assume that you have returned to your regular daily activities without incident.  If any biopsies were taken you will be contacted by phone or by letter within the next 1-3 weeks.  Please call us at [phone] if you have not heard about the biopsies in 3 weeks.    SIGNATURES/CONFIDENTIALITY: You and/or your care partner have signed paperwork which will be entered into your electronic medical record.  These signatures attest to the fact that that the information  above on your After Visit Summary has been reviewed and is understood.  Full responsibility of the confidentiality of this discharge information lies with you and/or your care-partner.

## 2017-11-26 ENCOUNTER — Telehealth: Payer: Self-pay

## 2017-11-26 NOTE — Telephone Encounter (Signed)
Attempted to reach patient for post-procedure f/u call. No answer. Left message that we will attempt to reach him again later today and for him to please not hesitate to call us if he has any questions/concerns regarding his care.

## 2017-11-26 NOTE — Telephone Encounter (Signed)
  Follow up Call-  Call back number 11/25/2017  Post procedure Call Back phone  # [phone]  Permission to leave phone message Yes  Some recent data might be hidden     Patient questions:  Do you have a fever, pain , or abdominal swelling? No. Pain Score  0 *  Have you tolerated food without any problems? Yes.    Have you been able to return to your normal activities? Yes.    Do you have any questions about your discharge instructions: Diet   No. Medications  No. Follow up visit  No.  Do you have questions or concerns about your Care? No.  Actions: * If pain score is 4 or above: No action needed, pain <4.

## 2017-12-02 ENCOUNTER — Telehealth: Payer: Self-pay | Admitting: *Deleted

## 2017-12-02 ENCOUNTER — Other Ambulatory Visit: Payer: Self-pay

## 2017-12-02 ENCOUNTER — Encounter: Payer: Self-pay | Admitting: *Deleted

## 2017-12-02 DIAGNOSIS — A048 Other specified bacterial intestinal infections: Secondary | ICD-10-CM

## 2017-12-02 MED ORDER — BIS SUBCIT-METRONID-TETRACYC 140-125-125 MG PO CAPS: 3.0000 | ORAL_CAPSULE | Freq: Three times a day (TID) | ORAL | 0 refills | Status: DC

## 2017-12-02 MED ORDER — OMEPRAZOLE 20 MG PO CPDR: 20.0000 mg | DELAYED_RELEASE_CAPSULE | Freq: Two times a day (BID) | ORAL | 0 refills | Status: DC

## 2017-12-02 MED ORDER — BISMUTH SUBSALICYLATE 262 MG PO CHEW: 524.0000 mg | CHEWABLE_TABLET | Freq: Four times a day (QID) | ORAL | 0 refills | Status: AC

## 2017-12-02 MED ORDER — DOXYCYCLINE HYCLATE 100 MG PO TABS: 100.0000 mg | ORAL_TABLET | Freq: Two times a day (BID) | ORAL | 0 refills | Status: AC

## 2017-12-02 MED ORDER — METRONIDAZOLE 250 MG PO TABS: 250.0000 mg | ORAL_TABLET | Freq: Four times a day (QID) | ORAL | 0 refills | Status: AC

## 2017-12-02 NOTE — Telephone Encounter (Signed)
Please break the therapy apart into 1.  Omeprazole 20 mg 2 times a day times 14 days 2.  Pepto-Bismol 2 tabs, 262 mg each, 4 times daily times 14 days 3.  Metronidazole 250 mg 4 times daily times 14 days 4.  Doxycycline 100 mg 2 times a day times 14 days  Have him let us know if he cannot complete this therapy.  He needs to complete entirely for best chance of cure

## 2017-12-02 NOTE — Telephone Encounter (Signed)
Insurance will not cover Pylera. Please advise on alternative.Marland KitchenMarland Kitchen

## 2017-12-02 NOTE — Telephone Encounter (Signed)
I have spoken to patient to advise of Dr Vena Rua recommendations as well as to let us know if he is unable to complete therapy. Patient verbalizes understanding.

## 2017-12-16 ENCOUNTER — Telehealth: Payer: Self-pay

## 2017-12-16 NOTE — Telephone Encounter (Signed)
Discussed possible side effects of medications. Pt reports they are not pleasant but he can tolerate the medications.

## 2018-02-03 ENCOUNTER — Other Ambulatory Visit: Payer: Self-pay | Admitting: Internal Medicine

## 2018-02-09 ENCOUNTER — Other Ambulatory Visit: Payer: Self-pay | Admitting: Internal Medicine

## 2018-02-13 ENCOUNTER — Telehealth: Payer: Self-pay | Admitting: Internal Medicine

## 2018-02-13 NOTE — Telephone Encounter (Signed)
Patient returned phone call. °

## 2018-02-13 NOTE — Telephone Encounter (Signed)
Attempted to call pt back but got message stating that the mailbox was full and could not accept any messages at this time. Will try again later.

## 2018-02-17 NOTE — Telephone Encounter (Signed)
Pt states he has been having a lot of bloating. He was supposed to come and give a stool specimen following his H pylori treatment. Discussed with pt that is overdue to have this done, let him know he can come Monday-Friday between 7:30am-5pm to the lab in the basement, he does not need an appointment. Pt also needs blood work done. Orders in epic.

## 2018-03-18 ENCOUNTER — Ambulatory Visit: Payer: Managed Care, Other (non HMO) | Admitting: Vascular Surgery

## 2018-03-18 ENCOUNTER — Encounter: Payer: Self-pay | Admitting: Vascular Surgery

## 2018-03-18 ENCOUNTER — Other Ambulatory Visit: Payer: Self-pay

## 2018-03-18 VITALS — BP 135/97 | HR 115 | Temp 100.2°F | Resp 16 | Ht 74.0 in | Wt 254.0 lb

## 2018-03-18 DIAGNOSIS — I83893 Varicose veins of bilateral lower extremities with other complications: Secondary | ICD-10-CM | POA: Diagnosis not present

## 2018-03-18 NOTE — Progress Notes (Signed)
[facility] [place]  Patient name: Martin [name] MRN: [id number] DOB: 1971/01/06 Sex: male  REASON FOR VISIT: Continued follow-up of painful varicose veins left leg  HPI: BERND CROM is a 47 y.o. male here today for continued follow-up.  I had seen him initially in 2011.  He had had laser ablation of great saphenous vein in an outlying vein center.  He was having difficulty with tributary varicosities but these were tolerable at that time.  He subsequently saw Dr. Scot Dock in our office in 2017.  He did undergo formal venous duplex at that time and I have this for review.  Did not show any evidence of great saphenous vein reflux.  He did have a large varicosity that arose just at the saphenofemoral junction over his medial anterior thigh and calf.  He is compliant with compression garments.  Despite this he has had marked progression of size and discomfort.  The varicosities now extend down into his ankle.  He also has multiple telangiectasia extending out onto his foot.  He has no difficulty on his right leg  Past Medical History:  Diagnosis Date  . H. pylori infection   . Varicose veins 2016   Left Leg    Family History  Problem Relation Age of Onset  . Diabetes Mother   . COPD Father   . Diabetes Father   . Hypertension Father   . Hypertension Brother   . Colon cancer Neg Hx   . Esophageal cancer Neg Hx   . Stomach cancer Neg Hx     SOCIAL HISTORY: Social History   Tobacco Use  . Smoking status: Former Smoker    Packs/day: 0.50    Years: 4.00    Pack years: 2.00    Types: Cigarettes    Last attempt to quit: 04/28/2012    Years since quitting: 5.8  . Smokeless tobacco: Never Used  Substance Use Topics  . Alcohol use: Yes    Alcohol/week: 8.4 oz    Types: 14 Glasses of wine per week    No Known Allergies  Current Outpatient Medications  Medication Sig Dispense Refill  . acetaminophen (TYLENOL) 500 MG  tablet Take 1,000 mg by mouth every 6 (six) hours as needed for pain.    Marland Kitchen lisinopril (PRINIVIL,ZESTRIL) 40 MG tablet Take 40 mg by mouth daily.    Marland Kitchen omeprazole (PRILOSEC) 20 MG capsule Take 1 capsule (20 mg total) by mouth daily. 30 capsule 1  . oxyCODONE-acetaminophen (PERCOCET/ROXICET) 5-325 MG per tablet Take 2 tablets by mouth every 4 (four) hours as needed for pain. (Patient taking differently: Take 2 tablets by mouth as needed. Back) 15 tablet 0  . vitamin B-12 (CYANOCOBALAMIN) 1000 MCG tablet Take 1,000 mcg by mouth daily.      Marland Kitchen omeprazole (PRILOSEC) 20 MG capsule Take 1 capsule (20 mg total) by mouth 2 (two) times daily before a meal for 14 days. 28 capsule 0   No current facility-administered medications for this visit.     REVIEW OF SYSTEMS:  [X]  denotes positive finding, [ ]  denotes negative finding Cardiac  Comments:  Chest pain or chest pressure:    Shortness of breath upon exertion:    Short of breath when lying flat:    Irregular heart rhythm:        Vascular    Pain in calf, thigh, or hip brought on by ambulation:    Pain in feet at night that wakes you up from  your sleep:     Blood clot in your veins:    Leg swelling:           PHYSICAL EXAM: Vitals:   03/18/18 1600  BP: (!) 135/97  Pulse: (!) 115  Resp: 16  Temp: 100.2 F (37.9 C)  TempSrc: Oral  SpO2: 96%  Weight: 254 lb (115.2 kg)  Height: 6' 2"  (1.88 m)    GENERAL: The patient is a well-nourished male, in no acute distress. The vital signs are documented above. CARDIOVASCULAR: Affable dorsalis pedis pulses bilaterally.  Extensive large marked varicosities throughout his left medial thigh medial calf and down onto his foot and ankle PULMONARY: There is good air exchange  MUSCULOSKELETAL: There are no major deformities or cyanosis. NEUROLOGIC: No focal weakness or paresthesias are detected. SKIN: There are no ulcers or rashes noted. PSYCHIATRIC: The patient has a normal affect.  DATA:  Reviewed  his prior duplex with no new imaging studies.  Ultrasound and this confirms reflux into this large varicosity the begins just below the saphenofemoral junction.  I did image his small saphenous vein and this is not enlarged and does not contribute to the calf varicosities  MEDICAL ISSUES: Progressive changes of varicosities now extending down to his foot.  I explained the treatment for this would be stab phlebectomy of these multiple varicosities.  In the procedure as outpatient under local tumescent anesthesia with greater than 20 phlebectomy sites.  I did explain that insurance will not pay for this unfortunately since this is not in conjunction with ablation of saphenous vein.  He is unable to tolerate this level of discomfort and wishes to proceed.  We will provide him with a out-of-pocket expense for him to make the decision.  Weaning towards having this corrected and will notify us when this is convenient for him    Rosetta Posner, MD [facility] Tel [phone] Pager [phone]

## 2018-04-07 ENCOUNTER — Other Ambulatory Visit: Payer: Self-pay

## 2018-04-07 ENCOUNTER — Telehealth: Payer: Self-pay

## 2018-04-07 ENCOUNTER — Other Ambulatory Visit (INDEPENDENT_AMBULATORY_CARE_PROVIDER_SITE_OTHER): Payer: Managed Care, Other (non HMO)

## 2018-04-07 DIAGNOSIS — A048 Other specified bacterial intestinal infections: Secondary | ICD-10-CM

## 2018-04-07 LAB — CBC WITH DIFFERENTIAL/PLATELET
BASOS PCT: 0.7 % (ref 0.0–3.0)
Basophils Absolute: 0.1 10*3/uL (ref 0.0–0.1)
EOS ABS: 0.1 10*3/uL (ref 0.0–0.7)
EOS PCT: 0.9 % (ref 0.0–5.0)
HEMATOCRIT: 42.9 % (ref 39.0–52.0)
Hemoglobin: 14.8 g/dL (ref 13.0–17.0)
LYMPHS PCT: 12.7 % (ref 12.0–46.0)
Lymphs Abs: 0.9 10*3/uL (ref 0.7–4.0)
MCHC: 34.4 g/dL (ref 30.0–36.0)
MCV: 86.9 fl (ref 78.0–100.0)
Monocytes Absolute: 1.1 10*3/uL — ABNORMAL HIGH (ref 0.1–1.0)
Monocytes Relative: 15.5 % — ABNORMAL HIGH (ref 3.0–12.0)
Neutro Abs: 4.9 10*3/uL (ref 1.4–7.7)
Neutrophils Relative %: 70.2 % (ref 43.0–77.0)
PLATELETS: 262 10*3/uL (ref 150.0–400.0)
RBC: 4.93 Mil/uL (ref 4.22–5.81)
RDW: 13.3 % (ref 11.5–15.5)
WBC: 7 10*3/uL (ref 4.0–10.5)

## 2018-04-07 LAB — IBC PANEL
Iron: 26 ug/dL — ABNORMAL LOW (ref 42–165)
Saturation Ratios: 5.6 % — ABNORMAL LOW (ref 20.0–50.0)
TRANSFERRIN: 334 mg/dL (ref 212.0–360.0)

## 2018-04-07 LAB — FERRITIN: Ferritin: 35.5 ng/mL (ref 22.0–322.0)

## 2018-04-07 NOTE — Telephone Encounter (Signed)
Lmtcb.  H.pylori order placed.

## 2018-04-09 ENCOUNTER — Other Ambulatory Visit: Payer: Managed Care, Other (non HMO)

## 2018-04-09 DIAGNOSIS — A048 Other specified bacterial intestinal infections: Secondary | ICD-10-CM

## 2018-04-10 LAB — HELICOBACTER PYLORI  SPECIAL ANTIGEN
MICRO NUMBER:: 90705255
SPECIMEN QUALITY: ADEQUATE

## 2018-10-01 ENCOUNTER — Encounter: Payer: Self-pay | Admitting: *Deleted

## 2018-10-09 ENCOUNTER — Ambulatory Visit: Payer: Managed Care, Other (non HMO) | Admitting: Internal Medicine

## 2018-11-13 ENCOUNTER — Ambulatory Visit: Payer: Managed Care, Other (non HMO) | Admitting: Internal Medicine

## 2020-12-26 ENCOUNTER — Encounter: Payer: Self-pay | Admitting: *Deleted

## 2020-12-27 ENCOUNTER — Other Ambulatory Visit: Payer: Self-pay

## 2020-12-27 ENCOUNTER — Ambulatory Visit: Payer: Managed Care, Other (non HMO) | Admitting: Gastroenterology

## 2020-12-27 ENCOUNTER — Encounter: Payer: Self-pay | Admitting: Gastroenterology

## 2020-12-27 VITALS — BP 130/84 | HR 77 | Ht 74.0 in | Wt 242.0 lb

## 2020-12-27 DIAGNOSIS — Z86010 Personal history of colonic polyps: Secondary | ICD-10-CM

## 2020-12-27 DIAGNOSIS — K625 Hemorrhage of anus and rectum: Secondary | ICD-10-CM

## 2020-12-27 MED ORDER — NA SULFATE-K SULFATE-MG SULF 17.5-3.13-1.6 GM/177ML PO SOLN
1.0000 | Freq: Once | ORAL | 0 refills | Status: AC
Start: 2020-12-27 — End: 2020-12-27

## 2020-12-27 NOTE — Progress Notes (Signed)
12/27/2020 Martin [name] [id number] 1971-07-30   HISTORY OF PRESENT ILLNESS: This is a 50 year old male is a patient of Dr. Vena Rua.  He had colonoscopy in January 2019 that showed multiple polyps that were removed, one that was 15 mm in size.  He also had diverticulosis.  These were all tubular adenomas.  It was recommended that he have a repeat colonoscopy at a 3-year interval.  EGD at that time showed LA grade B esophagitis, a 2 cm hiatal hernia, and gastritis.  Gastric biopsies showed H. pylori.  He was treated for this and eradication was confirmed with a follow-up stool antigen test.  Small bowel biopsies were normal.  He is here today to discuss his repeat colonoscopy.  Coincidentally he had an episode recently where he had a moderate amount of blood in his stool that tapered off over several days.  He also reports a general discomfort or unwell feeling in his gut.  He says that it is not nausea and it is not pain.  He just says that it never feels good.  Admits to some constipation, does not use anything regularly for that.  He denies sensation of heartburn or reflux.  Is not currently on any antireflux medications.  He said that he has tried those over-the-counter intermittently and they did not seem to help the sensation that he describes.  He was referred back here on this occasion by his PCP, Dr. Felicie Morn and/Dr. Maudie Mercury for evaluation of rectal bleeding.   Past Medical History:  Diagnosis Date  . Diverticulosis   . H. pylori infection   . Hiatal hernia   . Iron deficiency anemia   . Tubular adenoma of colon   . Varicose veins 2016   Left Leg   Past Surgical History:  Procedure Laterality Date  . COLONOSCOPY  2019  . UPPER GASTROINTESTINAL ENDOSCOPY  2019  . VASECTOMY      reports that he quit smoking about 8 years ago. His smoking use included cigarettes. He has a 2.00 pack-year smoking history. He has never used smokeless tobacco. He reports current alcohol use of  about 14.0 standard drinks of alcohol per week. He reports that he does not use drugs. family history includes COPD in his father; Diabetes in his father and mother; Hypertension in his brother and father. No Known Allergies    Outpatient Encounter Medications as of 12/27/2020  Medication Sig  . amphetamine-dextroamphetamine (ADDERALL) 10 MG tablet Take 10 mg by mouth 2 (two) times daily with a meal.  . lisinopril (PRINIVIL,ZESTRIL) 40 MG tablet Take 40 mg by mouth daily.  Marland Kitchen zolpidem (AMBIEN) 10 MG tablet Take 10 mg by mouth at bedtime as needed for sleep.  Marland Kitchen omeprazole (PRILOSEC) 20 MG capsule Take 1 capsule (20 mg total) by mouth 2 (two) times daily before a meal for 14 days.  . [DISCONTINUED] acetaminophen (TYLENOL) 500 MG tablet Take 1,000 mg by mouth every 6 (six) hours as needed for pain.  . [DISCONTINUED] omeprazole (PRILOSEC) 20 MG capsule Take 1 capsule (20 mg total) by mouth daily. (Patient not taking: Reported on 12/27/2020)  . [DISCONTINUED] oxyCODONE-acetaminophen (PERCOCET/ROXICET) 5-325 MG per tablet Take 2 tablets by mouth every 4 (four) hours as needed for pain. (Patient not taking: Reported on 12/27/2020)  . [DISCONTINUED] vitamin B-12 (CYANOCOBALAMIN) 1000 MCG tablet Take 1,000 mcg by mouth daily.     No facility-administered encounter medications on file as of 12/27/2020.     REVIEW OF SYSTEMS  : All  other systems reviewed and negative except where noted in the History of Present Illness.   PHYSICAL EXAM: BP 130/84   Pulse 77   Ht 6' 2"  (1.88 m)   Wt 242 lb (109.8 kg)   SpO2 97%   BMI 31.07 kg/m  General: Well developed white male in no acute distress Head: Normocephalic and atraumatic Eyes:  Sclerae anicteric, conjunctiva pink. Ears: Normal auditory acuity Lungs: Clear throughout to auscultation; no W/R/R. Heart: Regular rate and rhythm; no M/R/G. Abdomen: Soft, non-distended.  BS present.  Non-tender. Rectal:  Will be done at the time of  colonoscopy. Musculoskeletal: Symmetrical with no gross deformities  Skin: No lesions on visible extremities Extremities: No edema  Neurological: Alert oriented x 4, grossly non-focal Psychological:  Alert and cooperative. Normal mood and affect  ASSESSMENT AND PLAN: *Personal history of colon polyps: Several tubular adenomas removed in January 2019, one that was 15 mm in size.  Repeat colonoscopy is recommended at 3-year interval.  We will schedule this with Dr. Hilarie Fredrickson.  The risks, benefits, and alternatives to colonoscopy were discussed with the patient and he consents to proceed.  *Rectal bleeding: Had one episode of that recently, there was a moderate amount of blood and tapered off over several days.  Could have been a low-grade diverticular bleed.  Nonetheless, needs colonoscopy. *Question constipation/abdominal discomfort: No pain or nausea, etc.  He just describes a discomfort and unwell feeling in his gut.  Does seem to have some mild constipation.  I wonder if he moves his bowels more regularly if that would help.  I did recommend that he begin taking MiraLAX 1 capful mixed in 8 ounces of liquid daily.   CC:  Jani Gravel, MD  CC: Dr. Felicie Morn

## 2020-12-27 NOTE — Patient Instructions (Addendum)
If you are age 50 or older, your body mass index should be between 23-30. Your Body mass index is 31.07 kg/m. If this is out of the aforementioned range listed, please consider follow up with your Primary Care Provider.  If you are age 99 or younger, your body mass index should be between 19-25. Your Body mass index is 31.07 kg/m. If this is out of the aformentioned range listed, please consider follow up with your Primary Care Provider.   Start Miralax 1 capful daily in 8 ounces of liquid.  You have been scheduled for a colonoscopy. Please follow written instructions given to you at your visit today.  Please pick up your prep supplies at the pharmacy within the next 1-3 days. If you use inhalers (even only as needed), please bring them with you on the day of your procedure.  Due to recent changes in healthcare laws, you may see the results of your imaging and laboratory studies on MyChart before your provider has had a chance to review them.  We understand that in some cases there may be results that are confusing or concerning to you. Not all laboratory results come back in the same time frame and the provider may be waiting for multiple results in order to interpret others.  Please give [place] 48 hours in order for your provider to thoroughly review all the results before contacting the office for clarification of your results.

## 2020-12-30 NOTE — Progress Notes (Signed)
Addendum: Reviewed and agree with assessment and management plan. Deavion Strider M, MD  

## 2021-02-08 ENCOUNTER — Telehealth: Payer: Self-pay | Admitting: Internal Medicine

## 2021-02-08 MED ORDER — PANTOPRAZOLE SODIUM 40 MG PO TBEC: 40.0000 mg | DELAYED_RELEASE_TABLET | Freq: Every day | ORAL | 3 refills | Status: DC

## 2021-02-08 NOTE — Telephone Encounter (Signed)
Inbound call from patient. Have a procedure scheduled for 4/27. States he is having acid stomach pains, sore to the touch. Patient would like a call back to discuss is it something he should be called in for. States it is all day pain. Best contact [phone]

## 2021-02-08 NOTE — Telephone Encounter (Signed)
The pt called with complaints of Upper abd "sore to touch"  He says it happens everyday all day.  He wants to know what he should do and if an EGD should be added to his upcoming colonoscopy.  Please advise

## 2021-02-08 NOTE — Telephone Encounter (Signed)
The pt has been advised and agrees to prescription. I  have sent to his pharmacy and he will call if this does not relieve his symptoms.

## 2021-02-22 ENCOUNTER — Encounter: Payer: Self-pay | Admitting: Internal Medicine

## 2021-02-22 ENCOUNTER — Telehealth: Payer: Self-pay

## 2021-02-22 ENCOUNTER — Other Ambulatory Visit: Payer: Self-pay | Admitting: Internal Medicine

## 2021-02-22 ENCOUNTER — Ambulatory Visit (AMBULATORY_SURGERY_CENTER): Payer: Managed Care, Other (non HMO) | Admitting: Internal Medicine

## 2021-02-22 ENCOUNTER — Other Ambulatory Visit: Payer: Self-pay

## 2021-02-22 VITALS — BP 131/80 | HR 86 | Temp 98.2°F | Resp 20 | Ht 74.0 in | Wt 242.0 lb

## 2021-02-22 DIAGNOSIS — D125 Benign neoplasm of sigmoid colon: Secondary | ICD-10-CM

## 2021-02-22 DIAGNOSIS — D369 Benign neoplasm, unspecified site: Secondary | ICD-10-CM

## 2021-02-22 DIAGNOSIS — D123 Benign neoplasm of transverse colon: Secondary | ICD-10-CM

## 2021-02-22 DIAGNOSIS — D120 Benign neoplasm of cecum: Secondary | ICD-10-CM | POA: Diagnosis not present

## 2021-02-22 DIAGNOSIS — D122 Benign neoplasm of ascending colon: Secondary | ICD-10-CM | POA: Diagnosis not present

## 2021-02-22 DIAGNOSIS — Z860100 Personal history of colon polyps, unspecified: Secondary | ICD-10-CM

## 2021-02-22 MED ORDER — SODIUM CHLORIDE 0.9 % IV SOLN: 500.0000 mL | Freq: Once | INTRAVENOUS | Status: DC

## 2021-02-22 NOTE — Telephone Encounter (Signed)
Amb referral entered for genetic counselor for multiple adenomas prior to age 50.

## 2021-02-22 NOTE — Progress Notes (Signed)
Vs by CW

## 2021-02-22 NOTE — Op Note (Signed)
[place] Patient Name: Martin [name] Procedure Date: 02/22/2021 1:57 PM MRN: [id number] Endoscopist: Jerene Bears , MD Age: 50 Referring MD:  Date of Birth: December 08, 1970 Gender: Male Account #: [id number] Procedure:                Colonoscopy Indications:              High risk colon cancer surveillance: Personal                            history of multiple (8) adenomas, Last colonoscopy:                            January 2019 Medicines:                Monitored Anesthesia Care Procedure:                Pre-Anesthesia Assessment:                           - Prior to the procedure, a History and Physical                            was performed, and patient medications and                            allergies were reviewed. The patient's tolerance of                            previous anesthesia was also reviewed. The risks                            and benefits of the procedure and the sedation                            options and risks were discussed with the patient.                            All questions were answered, and informed consent                            was obtained. Prior Anticoagulants: The patient has                            taken no previous anticoagulant or antiplatelet                            agents. ASA Grade Assessment: II - A patient with                            mild systemic disease. After reviewing the risks                            and benefits, the patient was deemed in  satisfactory condition to undergo the procedure.                           After obtaining informed consent, the colonoscope                            was passed under direct vision. Throughout the                            procedure, the patient's blood pressure, pulse, and                            oxygen saturations were monitored continuously. The                            Olympus CF-HQ190L [id number]) Colonoscope was                             introduced through the anus and advanced to the                            terminal ileum. The colonoscopy was performed                            without difficulty. The patient tolerated the                            procedure well. The quality of the bowel                            preparation was good. The terminal ileum, ileocecal                            valve, appendiceal orifice, and rectum were                            photographed. Scope In: 2:11:38 PM Scope Out: 2:37:21 PM Scope Withdrawal Time: 0 hours 23 minutes 29 seconds  Total Procedure Duration: 0 hours 25 minutes 43 seconds  Findings:                 The digital rectal exam was normal.                           The terminal ileum appeared normal.                           A 2 mm polyp was found in the cecum. The polyp was                            sessile. The polyp was removed with a cold snare.                            Resection and retrieval were complete.  Three sessile polyps were found in the ascending                            colon. The polyps were 2 to 6 mm in size. These                            polyps were removed with a cold snare. Resection                            and retrieval were complete.                           Two sessile and semi-pedunculated polyps were found                            in the transverse colon. The polyps were 5 and 10                            mm in size. These polyps were removed with a cold                            snare. Resection and retrieval were complete.                           A 3 mm polyp was found in the sigmoid colon. The                            polyp was sessile. The polyp was removed with a                            cold snare. Resection and retrieval were complete.                           Multiple small and large-mouthed diverticula were                            found in the sigmoid colon and  descending colon.                           Internal hemorrhoids were found during                            retroflexion. The hemorrhoids were small. Complications:            No immediate complications. Estimated Blood Loss:     Estimated blood loss was minimal. Impression:               - The examined portion of the ileum was normal.                           - One 2 mm polyp in the cecum, removed with a cold  snare. Resected and retrieved.                           - Three 2 to 6 mm polyps in the ascending colon,                            removed with a cold snare. Resected and retrieved.                           - Two 5 and 10 mm polyps in the transverse colon,                            removed with a cold snare. Resected and retrieved.                           - One 3 mm polyp in the sigmoid colon, removed with                            a cold snare. Resected and retrieved.                           - Diverticulosis in the sigmoid colon and in the                            descending colon.                           - Small internal hemorrhoids. Recommendation:           - Patient has a contact number available for                            emergencies. The signs and symptoms of potential                            delayed complications were discussed with the                            patient. Return to normal activities tomorrow.                            Written discharge instructions were provided to the                            patient.                           - Resume previous diet.                           - Continue present medications.                           - Await pathology results.                           -  Repeat colonoscopy is recommended for                            surveillance. The colonoscopy date will be                            determined after pathology results from today's                            exam become  available for review. Jerene Bears, MD 02/22/2021 2:42:22 PM This report has been signed electronically.

## 2021-02-22 NOTE — Patient Instructions (Signed)
YOU HAD AN ENDOSCOPIC PROCEDURE TODAY AT THE [facility]:   Refer to the procedure report that was given to you for any specific questions about what was found during the examination.  If the procedure report does not answer your questions, please call your gastroenterologist to clarify.  If you requested that your care partner not be given the details of your procedure findings, then the procedure report has been included in a sealed envelope for you to review at your convenience later.  YOU SHOULD EXPECT: Some feelings of bloating in the abdomen. Passage of more gas than usual.  Walking can help get rid of the air that was put into your GI tract during the procedure and reduce the bloating. If you had a lower endoscopy (such as a colonoscopy or flexible sigmoidoscopy) you may notice spotting of blood in your stool or on the toilet paper. If you underwent a bowel prep for your procedure, you may not have a normal bowel movement for a few days.  Please Note:  You might notice some irritation and congestion in your nose or some drainage.  This is from the oxygen used during your procedure.  There is no need for concern and it should clear up in a day or so.  SYMPTOMS TO REPORT IMMEDIATELY:   Following lower endoscopy (colonoscopy or flexible sigmoidoscopy):  Excessive amounts of blood in the stool  Significant tenderness or worsening of abdominal pains  Swelling of the abdomen that is new, acute  Fever of 100F or higher   Following upper endoscopy (EGD)  Vomiting of blood or coffee ground material  New chest pain or pain under the shoulder blades  Painful or persistently difficult swallowing  New shortness of breath  Fever of 100F or higher  Black, tarry-looking stools  For urgent or emergent issues, a gastroenterologist can be reached at any hour by calling [phone]. Do not use MyChart messaging for urgent concerns.    DIET:  We do recommend a small meal at first, but  then you may proceed to your regular diet.  Drink plenty of fluids but you should avoid alcoholic beverages for 24 hours.  ACTIVITY:  You should plan to take it easy for the rest of today and you should NOT DRIVE or use heavy machinery until tomorrow (because of the sedation medicines used during the test).    FOLLOW UP: Our staff will call the number listed on your records 48-72 hours following your procedure to check on you and address any questions or concerns that you may have regarding the information given to you following your procedure. If we do not reach you, we will leave a message.  We will attempt to reach you two times.  During this call, we will ask if you have developed any symptoms of COVID 19. If you develop any symptoms (ie: fever, flu-like symptoms, shortness of breath, cough etc.) before then, please call [phone].  If you test positive for Covid 19 in the 2 weeks post procedure, please call and report this information to us.    If any biopsies were taken you will be contacted by phone or by letter within the next 1-3 weeks.  Please call us at [phone] if you have not heard about the biopsies in 3 weeks.    SIGNATURES/CONFIDENTIALITY: You and/or your care partner have signed paperwork which will be entered into your electronic medical record.  These signatures attest to the fact that that the information above on   your After Visit Summary has been reviewed and is understood.  Full responsibility of the confidentiality of this discharge information lies with you and/or your care-partner. 

## 2021-02-22 NOTE — Progress Notes (Signed)
A and O x3. Report to RN. Tolerated MAC anesthesia well.

## 2021-02-22 NOTE — Telephone Encounter (Signed)
-----   Message from Jerene Bears, MD sent at 02/22/2021  3:00 PM EDT ----- Medical genetics referral please Personal hx of > 10 adenomas before age 50. Thanks [employer]

## 2021-02-22 NOTE — Progress Notes (Signed)
Called to room to assist during endoscopic procedure.  Patient ID and intended procedure confirmed with present staff. Received instructions for my participation in the procedure from the performing physician.  

## 2021-02-23 ENCOUNTER — Telehealth: Payer: Self-pay | Admitting: Licensed Clinical Social Worker

## 2021-02-23 NOTE — Telephone Encounter (Signed)
Received a genetic counseling referral from Dr. Hilarie Fredrickson for a hx of colon polyps. [name] has been cld and scheduled to see Brianna on 5/9 at 2pm. Pt aware to arrive 20 minutes early.

## 2021-02-24 ENCOUNTER — Telehealth: Payer: Self-pay

## 2021-02-24 ENCOUNTER — Telehealth: Payer: Self-pay | Admitting: *Deleted

## 2021-02-24 NOTE — Telephone Encounter (Signed)
Attempted phone call. No answer. Left message.

## 2021-02-24 NOTE — Telephone Encounter (Signed)
  Follow up Call-  Call back number 02/22/2021  Post procedure Call Back phone  # [phone]  Permission to leave phone message Yes  Some recent data might be hidden     Patient questions:  Do you have a fever, pain , or abdominal swelling? No. Pain Score  0 *  Have you tolerated food without any problems? Yes.    Have you been able to return to your normal activities? Yes.    Do you have any questions about your discharge instructions: Diet   No. Medications  No. Follow up visit  No.  Do you have questions or concerns about your Care? No.  Actions: * If pain score is 4 or above: No action needed, pain <4. 1. Have you developed a fever since your procedure? no  2.   Have you had an respiratory symptoms (SOB or cough) since your procedure? no  3.   Have you tested positive for COVID 19 since your procedure no  4.   Have you had any family members/close contacts diagnosed with the COVID 19 since your procedure?  no   If yes to any of these questions please route to Joylene John, RN and Joella Prince, RN

## 2021-03-03 ENCOUNTER — Encounter: Payer: Self-pay | Admitting: Internal Medicine

## 2021-03-06 ENCOUNTER — Inpatient Hospital Stay: Payer: Managed Care, Other (non HMO)

## 2021-03-06 ENCOUNTER — Other Ambulatory Visit: Payer: Self-pay

## 2021-03-06 ENCOUNTER — Encounter: Payer: Self-pay | Admitting: Licensed Clinical Social Worker

## 2021-03-06 ENCOUNTER — Inpatient Hospital Stay: Payer: Managed Care, Other (non HMO) | Attending: Genetic Counselor | Admitting: Licensed Clinical Social Worker

## 2021-03-06 DIAGNOSIS — Z860100 Personal history of colon polyps, unspecified: Secondary | ICD-10-CM | POA: Insufficient documentation

## 2021-03-06 LAB — GENETIC SCREENING ORDER

## 2021-03-06 NOTE — Progress Notes (Signed)
REFERRING PROVIDER: Jerene Bears, MD [address],  [place] [address]  PRIMARY PROVIDER:  Merrilee Seashore, MD  PRIMARY REASON FOR VISIT:  1. Personal history of colonic polyps      HISTORY OF PRESENT ILLNESS:   Mr. Eskelson, a 50 y.o. male, was seen for a [place] cancer genetics consultation at the request of Dr. Hilarie Fredrickson due to a personal history of colon polyps.  Mr. Buczynski presents to clinic today to discuss the possibility of a hereditary predisposition to cancer, genetic testing, and to further clarify his future cancer risks, as well as potential cancer risks for family members.    Mr. Riches is a 50 y.o. male with no personal history of cancer.  In 2019, he had a colonoscopy that revealed 8 tubular adenomas, and in 2022 he had a colonoscopy that revealed 7 tubular adenomas for a cumulative total of 15. He reports no other cancer screenings.   CANCER HISTORY:  Oncology History   No history exists.     Past Medical History:  Diagnosis Date  . Diverticulosis   . H. pylori infection   . Hiatal hernia   . Iron deficiency anemia   . Personal history of colonic polyps   . Tubular adenoma of colon   . Varicose veins 2016   Left Leg    Past Surgical History:  Procedure Laterality Date  . COLONOSCOPY  2019  . UPPER GASTROINTESTINAL ENDOSCOPY  2019  . VASECTOMY      Social History   Socioeconomic History  . Marital status: Divorced    Spouse name: Not on file  . Number of children: Not on file  . Years of education: Not on file  . Highest education level: Not on file  Occupational History  . Not on file  Tobacco Use  . Smoking status: Former Smoker    Packs/day: 0.50    Years: 4.00    Pack years: 2.00    Types: Cigarettes    Quit date: 04/28/2012    Years since quitting: 8.8  . Smokeless tobacco: Never Used  Substance and Sexual Activity  . Alcohol use: Yes    Alcohol/week: 14.0 standard drinks    Types: 14 Glasses of wine per week  . Drug use:  No  . Sexual activity: Yes  Other Topics Concern  . Not on file  Social History Narrative  . Not on file   Social Determinants of Health   Financial Resource Strain: Not on file  Food Insecurity: Not on file  Transportation Needs: Not on file  Physical Activity: Not on file  Stress: Not on file  Social Connections: Not on file     FAMILY HISTORY:  We obtained a detailed, 4-generation family history.  Significant diagnoses are listed below: Family History  Problem Relation Age of Onset  . Diabetes Mother   . COPD Father   . Diabetes Father   . Hypertension Father   . Hypertension Brother   . Colon polyps Brother   . Cancer Cousin        metastatic, d. 72  . Colon cancer Neg Hx   . Esophageal cancer Neg Hx   . Stomach cancer Neg Hx    Mr. Ketchum has 2 sons, no cancers or polyps. He has 1 brother and 1 sister. His brother, 46, has had a colonoscopy and he believes a few polyps were found. His sister has not had a colonoscopy yet.   Mr. Blanke's father died at 48  of COPD. Patient had 3 paternal uncles. None have had cancer. A paternal cousin died at 81 of metastatic cancer that may have started in his stomach. Paternal grandmother passed at 41, grandfather died in his 1s of an infection.  Mr. Spratt's mother is living at 63. Patient has 1 maternal uncle, no cancers. Maternal grandmother passed at 55, grandfather passed at 26. No one else in the family has had polyps that he is aware of.   Mr. Polak is unaware of previous family history of genetic testing for hereditary cancer risks. Patient's maternal ancestors are of unknown descent, and paternal ancestors are of [place] descent. There is no reported Ashkenazi Jewish ancestry. There is no known consanguinity.    GENETIC COUNSELING ASSESSMENT: Mr. Hunley is a 50 y.o. male with a personal history of 15 colon polyps which is somewhat suggestive of a hereditary cancer syndrome and predisposition to cancer. We, therefore,  discussed and recommended the following at today's visit.   DISCUSSION: We discussed that polyps in general are common, however, most people have fewer than 5 lifetime polyps.  When an individual has 10 or more polyps we become concerned about an underlying polyposis syndrome.  The most common hereditary polyposis syndromes are caused by problems in the APC and MUTYH genes, however, more recently, mutations in the [place] and MSH3 genes have been identified in some polyposis families. We discussed that testing is beneficial for several reasons including knowing how to follow individuals for cancer screenings, and understand if other family members could be at risk for cancer and allow them to undergo genetic testing.   We reviewed the characteristics, features and inheritance patterns of hereditary cancer syndromes. We also discussed genetic testing, including the appropriate family members to test, the process of testing, insurance coverage and turn-around-time for results. We discussed the implications of a negative, positive and/or variant of uncertain significant result. We recommended Mr. Klatt pursue genetic testing for the [facility] CancerNext-Expanded+RNA gene panel.   The CancerNext-Expanded + RNAinsight gene panel offered by [employer] and includes sequencing and rearrangement analysis for the following 77 genes: IP, ALK, APC*, ATM*, AXIN2, BAP1, BARD1, BLM, BMPR1A, BRCA1*, BRCA2*, BRIP1*, CDC73, CDH1*,CDK4, CDKN1B, CDKN2A, CHEK2*, CTNNA1, DICER1, FANCC, FH, FLCN, GALNT12, KIF1B, LZTR1, MAX, MEN1, MET, MLH1*, MSH2*, MSH3, MSH6*, MUTYH*, NBN, NF1*, NF2, NTHL1, PALB2*, PHOX2B, PMS2*, POT1, PRKAR1A, PTCH1, PTEN*, RAD51C*, RAD51D*,RB1, RECQL, RET, SDHA, SDHAF2, SDHB, SDHC, SDHD, SMAD4, SMARCA4, SMARCB1, SMARCE1, STK11, SUFU, TMEM127, TP53*,TSC1, TSC2, VHL and XRCC2 (sequencing and deletion/duplication); EGFR, EGLN1, HOXB13, KIT, MITF, PDGFRA, POLD1 and POLE (sequencing only); EPCAM and GREM1  (deletion/duplication only).  Based on Mr. Rake's personal history of polyps  he meets medical criteria for genetic testing. Despite that he meets criteria, he may still have an out of pocket cost. We discussed that if his out of pocket cost for testing is over $100, the laboratory will call and confirm whether he wants to proceed with testing.  If the out of pocket cost of testing is less than $100 he will be billed by the genetic testing laboratory.   PLAN: After considering the risks, benefits, and limitations, Mr. Brisco provided informed consent to pursue genetic testing and the blood sample was sent to [facility] for analysis of the CancerNext-Expanded+RNA panel. Results should be available within approximately 2-3 weeks' time, at which point they will be disclosed by telephone to Mr. Roher, as will any additional recommendations warranted by these results. Mr. Hilliker will receive a summary of his genetic counseling visit  and a copy of his results once available. This information will also be available in Epic.   Mr. Decicco's questions were answered to his satisfaction today. Our contact information was provided should additional questions or concerns arise. Thank you for the referral and allowing [place] to share in the care of your patient.   Faith Rogue, MS, [facility] Genetic Counselor [email or web address] Phone: [phone]  The patient was seen for a total of 25 minutes in face-to-face genetic counseling.  Dr. Grayland Ormond was available for discussion regarding this case.   _______________________________________________________________________ For Office Staff:  Number of people involved in session: 1 Was an Intern/ student involved with case: no

## 2021-03-22 ENCOUNTER — Encounter: Payer: Self-pay | Admitting: Licensed Clinical Social Worker

## 2021-03-22 ENCOUNTER — Telehealth: Payer: Self-pay | Admitting: Licensed Clinical Social Worker

## 2021-03-22 ENCOUNTER — Ambulatory Visit: Payer: Self-pay | Admitting: Licensed Clinical Social Worker

## 2021-03-22 DIAGNOSIS — Z1379 Encounter for other screening for genetic and chromosomal anomalies: Secondary | ICD-10-CM

## 2021-03-22 DIAGNOSIS — Z86010 Personal history of colonic polyps: Secondary | ICD-10-CM

## 2021-03-22 NOTE — Progress Notes (Signed)
HPI:  Mr. Atkerson was previously seen in the [facility] due to a personal history of polyps and concerns regarding a hereditary predisposition to cancer. Please refer to our prior cancer genetics clinic note for more information regarding our discussion, assessment and recommendations, at the time. Mr. Patton's recent genetic test results were disclosed to him, as were recommendations warranted by these results. These results and recommendations are discussed in more detail below.  CANCER HISTORY:  Oncology History   No history exists.    FAMILY HISTORY:  We obtained a detailed, 4-generation family history.  Significant diagnoses are listed below: Family History  Problem Relation Age of Onset  . Diabetes Mother   . COPD Father   . Diabetes Father   . Hypertension Father   . Hypertension Brother   . Colon polyps Brother   . Cancer Cousin        metastatic, d. 60  . Colon cancer Neg Hx   . Esophageal cancer Neg Hx   . Stomach cancer Neg Hx    Mr. Allen has 2 sons, no cancers or polyps. He has 1 brother and 1 sister. His brother, 97, has had a colonoscopy and he believes a few polyps were found. His sister has not had a colonoscopy yet.   Mr. Lofstrom's father died at 68 of COPD. Patient had 3 paternal uncles. None have had cancer. A paternal cousin died at 52 of metastatic cancer that may have started in his stomach. Paternal grandmother passed at 105, grandfather died in his 30s of an infection.  Mr. Machi's mother is living at 63. Patient has 1 maternal uncle, no cancers. Maternal grandmother passed at 99, grandfather passed at 75. No one else in the family has had polyps that he is aware of.   Mr. Dase is unaware of previous family history of genetic testing for hereditary cancer risks. Patient's maternal ancestors are of unknown descent, and paternal ancestors are of [place] descent. There is no reported Ashkenazi Jewish ancestry. There is no known  consanguinity.     GENETIC TEST RESULTS: Genetic testing reported out on 03/21/2021 through the [facility] CancerNext-Expanded+RNA cancer panel found no pathogenic mutations.   The CancerNext-Expanded + RNAinsight gene panel offered by [facility] and includes sequencing and rearrangement analysis for the following 77 genes: IP, ALK, APC*, ATM*, AXIN2, BAP1, BARD1, BLM, BMPR1A, BRCA1*, BRCA2*, BRIP1*, CDC73, CDH1*,CDK4, CDKN1B, CDKN2A, CHEK2*, CTNNA1, DICER1, FANCC, FH, FLCN, GALNT12, KIF1B, LZTR1, MAX, MEN1, MET, MLH1*, MSH2*, MSH3, MSH6*, MUTYH*, NBN, NF1*, NF2, NTHL1, PALB2*, PHOX2B, PMS2*, POT1, PRKAR1A, PTCH1, PTEN*, RAD51C*, RAD51D*,RB1, RECQL, RET, SDHA, SDHAF2, SDHB, SDHC, SDHD, SMAD4, SMARCA4, SMARCB1, SMARCE1, STK11, SUFU, TMEM127, TP53*,TSC1, TSC2, VHL and XRCC2 (sequencing and deletion/duplication); EGFR, EGLN1, HOXB13, KIT, MITF, PDGFRA, POLD1 and POLE (sequencing only); EPCAM and GREM1 (deletion/duplication only).   The test report has been scanned into EPIC and is located under the Molecular Pathology section of the Results Review tab.  A portion of the result report is included below for reference.     We discussed with Mr. Schroth that because current genetic testing is not perfect, it is possible there may be a gene mutation in one of these genes that current testing cannot detect, but that chance is small.  We also discussed, that there could be another gene that has not yet been discovered, or that we have not yet tested, that is responsible for the cancer diagnoses in the family. It is also possible there is a hereditary cause  for the cancer in the family that Mr. Nicotra did not inherit and therefore was not identified in his testing.  Therefore, it is important to remain in touch with cancer genetics in the future so that we can continue to offer Mr. Polansky the most up to date genetic testing.   ADDITIONAL GENETIC TESTING: We discussed with Mr. Scarano that his genetic testing was  fairly extensive.  If there are genes identified to increase cancer risk that can be analyzed in the future, we would be happy to discuss and coordinate this testing at that time.    CANCER SCREENING RECOMMENDATIONS: Mr. Kutch's test result is considered negative (normal).  This means that we have not identified a hereditary cause for his  family history of cancer at this time.  While reassuring, this does not definitively rule out a hereditary predisposition to cancer. It is still possible that there could be genetic mutations that are undetectable by current technology. There could be genetic mutations in genes that have not been tested or identified to increase cancer risk.  Therefore, it is recommended he continue to follow the cancer management and screening guidelines provided by his  primary healthcare provider.   This negative genetic test simply tells [place] that we cannot yet define why Mr. Maino has had an increased number of colorectal polyps.  Mr. Wissner's medical management and screening should be based on the prospect that he will likely form more colon polyps and should, therefore, undergo more frequent colonoscopy screening at intervals determined by his GI providers.  We also recommended that Mr. Youngblood have an upper endoscopy periodically.  An individual's cancer risk and medical management are not determined by genetic test results alone. Overall cancer risk assessment incorporates additional factors, including personal medical history, family history, and any available genetic information that may result in a personalized plan for cancer prevention and surveillance.  RECOMMENDATIONS FOR FAMILY MEMBERS:  Relatives in this family might be at some increased risk of developing cancer, over the general population risk, simply due to the family history of cancer.  We recommended male relatives in this family have a yearly mammogram beginning at age 55, or 51 years younger than the earliest  onset of cancer, an annual clinical breast exam, and perform monthly breast self-exams. Male relatives in this family should also have a gynecological exam as recommended by their primary provider.  All family members should be referred for colonoscopy starting at age 58.   FOLLOW-UP: Lastly, we discussed with Mr. Mcauliffe that cancer genetics is a rapidly advancing field and it is possible that new genetic tests will be appropriate for him and/or his family members in the future. We encouraged him to remain in contact with cancer genetics on an annual basis so we can update his personal and family histories and let him know of advances in cancer genetics that may benefit this family.   Our contact number was provided. Mr. Eldred's questions were answered to his satisfaction, and he knows he is welcome to call us at anytime with additional questions or concerns.   Faith Rogue, MS, [email or web address] Phone: [phone]

## 2021-03-22 NOTE — Telephone Encounter (Signed)
Revealed negative genetic testing.  We discussed that we do not know why he has had colon polyps or why there is cancer in the family. It could be due to a different gene that we are not testing, or something our current technology cannot pick up.  It will be important for him to keep in contact with genetics to learn if additional testing may be needed in the future.

## 2021-12-18 ENCOUNTER — Telehealth: Payer: Self-pay | Admitting: Internal Medicine

## 2021-12-18 DIAGNOSIS — A048 Other specified bacterial intestinal infections: Secondary | ICD-10-CM

## 2021-12-18 NOTE — Telephone Encounter (Signed)
Inbound call from patient stated that he is having abd pain and some bloating. Patient is seeking advice if he needs to come in or if he can take something for it. Please advise.

## 2021-12-18 NOTE — Telephone Encounter (Signed)
Please asked patient is he taking PPI. I do recommend H. pylori stool antigen, but PPIs should be held for 5 to 7 days before submission Once he submits test I would resume pantoprazole 40 mg twice daily for 2 to 4 weeks and then once daily thereafter If abdominal pain continues then I would recommend office visit; will await H. pylori results

## 2021-12-18 NOTE — Telephone Encounter (Signed)
Pt reports he has been treated for H pylori a few times and he has had ulcers in the past. States he has some bloating and his stomach is constantly hurting. He describes a burning discomfort in the epigastric region. Pt not sure if he needs to be seen or just be tested for H pylori again. He wants to know what Dr. Hilarie Fredrickson recommends. Please advise.

## 2021-12-19 ENCOUNTER — Other Ambulatory Visit: Payer: Self-pay

## 2021-12-19 DIAGNOSIS — Z8619 Personal history of other infectious and parasitic diseases: Secondary | ICD-10-CM

## 2021-12-19 MED ORDER — PANTOPRAZOLE SODIUM 40 MG PO TBEC: DELAYED_RELEASE_TABLET | ORAL | 3 refills | Status: DC

## 2021-12-19 NOTE — Telephone Encounter (Signed)
Left message for pt to call back. Order in epic for lab.

## 2021-12-19 NOTE — Telephone Encounter (Signed)
Spoke with pt and he is aware of Dr. Vena Rua recommendations, he has not been taking any PPI. He will come for lab, order in epic and new script sent to pharmacy for pt.

## 2022-02-11 ENCOUNTER — Other Ambulatory Visit: Payer: Self-pay

## 2022-02-11 ENCOUNTER — Emergency Department (HOSPITAL_BASED_OUTPATIENT_CLINIC_OR_DEPARTMENT_OTHER)
Admission: EM | Admit: 2022-02-11 | Discharge: 2022-02-12 | Disposition: A | Discharge status: Home / Self Care | Payer: Managed Care, Other (non HMO) | Attending: Emergency Medicine | Admitting: Emergency Medicine

## 2022-02-11 ENCOUNTER — Encounter (HOSPITAL_BASED_OUTPATIENT_CLINIC_OR_DEPARTMENT_OTHER): Payer: Self-pay | Admitting: Emergency Medicine

## 2022-02-11 DIAGNOSIS — Z79899 Other long term (current) drug therapy: Secondary | ICD-10-CM | POA: Insufficient documentation

## 2022-02-11 DIAGNOSIS — T7840XA Allergy, unspecified, initial encounter: Secondary | ICD-10-CM

## 2022-02-11 DIAGNOSIS — T7802XA Anaphylactic reaction due to shellfish (crustaceans), initial encounter: Secondary | ICD-10-CM | POA: Diagnosis not present

## 2022-02-11 DIAGNOSIS — L509 Urticaria, unspecified: Secondary | ICD-10-CM | POA: Diagnosis not present

## 2022-02-11 DIAGNOSIS — I10 Essential (primary) hypertension: Secondary | ICD-10-CM | POA: Diagnosis not present

## 2022-02-11 HISTORY — DX: Essential (primary) hypertension: I10

## 2022-02-11 MED ORDER — FAMOTIDINE IN NACL 20-0.9 MG/50ML-% IV SOLN
20.0000 mg | Freq: Once | INTRAVENOUS | Status: AC
  Administered 2022-02-11: 20 mg via INTRAVENOUS
  Filled 2022-02-11: qty 50

## 2022-02-11 MED ORDER — DIPHENHYDRAMINE HCL 50 MG/ML IJ SOLN
25.0000 mg | Freq: Once | INTRAMUSCULAR | Status: AC
  Administered 2022-02-11: 25 mg via INTRAVENOUS
  Filled 2022-02-11: qty 1

## 2022-02-11 MED ORDER — EPINEPHRINE 0.3 MG/0.3ML IJ SOAJ: 0.3000 mg | Freq: Once | INTRAMUSCULAR | Status: AC

## 2022-02-11 MED ORDER — METHYLPREDNISOLONE SODIUM SUCC 125 MG IJ SOLR
125.0000 mg | Freq: Once | INTRAMUSCULAR | Status: AC
  Administered 2022-02-11: 125 mg via INTRAVENOUS
  Filled 2022-02-11: qty 2

## 2022-02-11 MED ORDER — EPINEPHRINE 0.3 MG/0.3ML IJ SOAJ
INTRAMUSCULAR | Status: AC
  Administered 2022-02-11: 0.3 mg via INTRAMUSCULAR
  Filled 2022-02-11: qty 0.3

## 2022-02-11 NOTE — ED Triage Notes (Signed)
Pt reports possible allergic reaction to shrimp. Rash present on chest, back, abdomen, neck and face. Endorses tongue feels tingly ?

## 2022-02-11 NOTE — ED Provider Notes (Signed)
? ?[facility]  ?Provider Note ? ?CSN: [id number] ?Arrival date & time: 02/11/22 2325 ? ?History ?Chief Complaint  ?Patient presents with  ? Allergic Reaction  ? ? ?Martin [name] is a 51 y.o. male with PMH of HTN reports sudden onset of itchy rash to his neck spread to his arms, legs, trunk and face after eating shrimp. No prior history of similar reaction. He reports some tongue thickness, but no difficulty breathing or swallowing. He was on lisinopril until about a week ago he was switched to an ARB due to some abdominal pain attributed to the lisinopril.  ? ? ?Home Medications ?Prior to Admission medications   ?Medication Sig Start Date End Date Taking? Authorizing Provider  ?EPINEPHrine 0.3 mg/0.3 mL IJ SOAJ injection Inject 0.3 mg into the muscle as needed for anaphylaxis. 02/12/22  Yes Truddie Hidden, MD  ?predniSONE (DELTASONE) 10 MG tablet Take 2 tablets (20 mg total) by mouth daily. 02/12/22  Yes Truddie Hidden, MD  ?amphetamine-dextroamphetamine (ADDERALL) 10 MG tablet Take 10 mg by mouth 2 (two) times daily with a meal.    [provider]  ?omeprazole (PRILOSEC) 20 MG capsule Take 1 capsule (20 mg total) by mouth 2 (two) times daily before a meal for 14 days. 12/02/17 12/16/17  Pyrtle, Lajuan Lines, MD  ?pantoprazole (PROTONIX) 40 MG tablet Take 1 tablet (40 mg total) by mouth daily. 02/08/21 03/10/21  Zehr, Laban Emperor, PA-C  ?pantoprazole (PROTONIX) 40 MG tablet Take 1 tablet by mouth twice a day for 4 weeks then take 1 tablet daily 12/19/21   Pyrtle, Lajuan Lines, MD  ?zolpidem (AMBIEN) 10 MG tablet Take 10 mg by mouth at bedtime as needed for sleep.    [provider]  ? ? ? ?Allergies    ?Patient has no known allergies. ? ? ?Review of Systems   ?Review of Systems ?Please see HPI for pertinent positives and negatives ? ?Physical Exam ?BP 130/89   Pulse 83   Resp 15   Ht 6' 2"  (1.88 m)   Wt 109.8 kg   SpO2 96%   BMI 31.08 kg/m?  ? ?Physical Exam ?Vitals and nursing  note reviewed.  ?Constitutional:   ?   Appearance: Normal appearance.  ?HENT:  ?   Head: Normocephalic and atraumatic.  ?   Nose: Nose normal.  ?   Mouth/Throat:  ?   Mouth: Mucous membranes are moist.  ?   Comments: Mild lower lip and tongue angioedema, no involvement of posterior pharynx ?Eyes:  ?   Extraocular Movements: Extraocular movements intact.  ?   Conjunctiva/sclera: Conjunctivae normal.  ?Cardiovascular:  ?   Rate and Rhythm: Normal rate.  ?Pulmonary:  ?   Effort: Pulmonary effort is normal.  ?   Breath sounds: Normal breath sounds. No stridor. No wheezing.  ?Abdominal:  ?   General: Abdomen is flat.  ?   Palpations: Abdomen is soft.  ?   Tenderness: There is no abdominal tenderness.  ?Musculoskeletal:     ?   General: No swelling. Normal range of motion.  ?   Cervical back: Neck supple.  ?Skin: ?   General: Skin is warm and dry.  ?   Findings: Rash (diffuse urticarial rash to extremities and trunk) present.  ?Neurological:  ?   General: No focal deficit present.  ?   Mental Status: He is alert.  ?Psychiatric:     ?   Mood and Affect: Mood normal.  ? ? ?  ED Results / Procedures / Treatments   ?EKG ?EKG Interpretation ? ?Date/Time:  Sunday February 11 2022 23:35:09 EDT ?Ventricular Rate:  113 ?PR Interval:  144 ?QRS Duration: 89 ?QT Interval:  333 ?QTC Calculation: 457 ?R Axis:   217 ?Text Interpretation: Sinus tachycardia Right axis deviation Abnormal R-wave progression, early transition No significant change since last tracing Confirmed by Calvert Cantor [phone]) on 02/11/2022 11:48:08 PM ? ?Procedures ?Procedures ? ?Medications Ordered in the ED ?Medications  ?methylPREDNISolone sodium succinate (SOLU-MEDROL) 125 mg/2 mL injection 125 mg (125 mg Intravenous Given 02/11/22 2339)  ?diphenhydrAMINE (BENADRYL) injection 25 mg (25 mg Intravenous Given 02/11/22 2337)  ?famotidine (PEPCID) IVPB 20 mg premix (0 mg Intravenous Stopped 02/12/22 0023)  ?EPINEPHrine (EPI-PEN) injection 0.3 mg (0.3 mg Intramuscular Given  02/11/22 2335)  ?lactated ringers bolus 1,000 mL (0 mLs Intravenous Stopped 02/12/22 0231)  ? ? ?Initial Impression and Plan ? Patient with allergic reaction, likely to shrimp, less likely ARB he has recently started taking. Has been off ACE for about a week, so probably not that. Will give steroids, benadryl and pepcid. Given face/lip/tongue involvement, add Epi. Monitor for 4 hours in the ED for recurrence.  ? ?ED Course  ? ?Clinical Course as of 02/12/22 0316  ?Mon Feb 12, 2022  ?0118 Rash is improving. BP is borderline low but he is asymptomatic, will give a LR bolus and continue to monitor.  [CS]  ?0313 BP improved, patient continues to improve. Plan d/c with Rx for prednisone, epipen. Advised to avoid shrimp, discuss ARB with PCP. RTED for any other concerns.  [CS]  ?  ?Clinical Course User Index ?[CS] Truddie Hidden, MD  ? ? ? ?MDM Rules/Calculators/A&P ?Medical Decision Making ?Problems Addressed: ?Allergic reaction, initial encounter: acute illness or injury ? ?Amount and/or Complexity of Data Reviewed ?ECG/medicine tests: ordered and independent interpretation performed. Decision-making details documented in ED Course. ? ?Risk ?Prescription drug management. ? ?Critical Care ?Total time providing critical care: 40 minutes ? ? ?Final Clinical Impression(s) / ED Diagnoses ?Final diagnoses:  ?Allergic reaction, initial encounter  ? ? ?Rx / DC Orders ?ED Discharge Orders   ? ?      Ordered  ?  predniSONE (DELTASONE) 10 MG tablet  Daily       ? 02/12/22 0316  ?  EPINEPHrine 0.3 mg/0.3 mL IJ SOAJ injection  As needed       ? 02/12/22 0316  ? ?  ?  ? ?  ? ?  ?Truddie Hidden, MD ?02/12/22 [phone] ? ?

## 2022-02-12 ENCOUNTER — Encounter (HOSPITAL_BASED_OUTPATIENT_CLINIC_OR_DEPARTMENT_OTHER): Payer: Self-pay

## 2022-02-12 MED ORDER — LACTATED RINGERS IV BOLUS
1000.0000 mL | Freq: Once | INTRAVENOUS | Status: AC
  Administered 2022-02-12: 1000 mL via INTRAVENOUS

## 2022-02-12 MED ORDER — PREDNISONE 10 MG PO TABS: 20.0000 mg | ORAL_TABLET | Freq: Every day | ORAL | 0 refills | Status: DC

## 2022-02-12 MED ORDER — EPINEPHRINE 0.3 MG/0.3ML IJ SOAJ: 0.3000 mg | INTRAMUSCULAR | 0 refills | Status: DC | PRN

## 2022-02-12 MED ORDER — PREDNISONE 20 MG PO TABS: 60.0000 mg | ORAL_TABLET | Freq: Every day | ORAL | 0 refills | Status: AC

## 2022-02-12 MED ORDER — EPINEPHRINE 0.3 MG/0.3ML IJ SOAJ: 0.3000 mg | INTRAMUSCULAR | 0 refills | Status: AC | PRN

## 2022-02-12 NOTE — ED Notes (Signed)
Patient discharged to home.  All discharge instructions reviewed.  Patient verbalized understanding via teachback method.  VS WDL.  Respirations even and unlabored.  Ambulatory out of ED.   °

## 2022-03-08 ENCOUNTER — Ambulatory Visit: Payer: Managed Care, Other (non HMO) | Admitting: Podiatry

## 2022-11-13 ENCOUNTER — Emergency Department (HOSPITAL_BASED_OUTPATIENT_CLINIC_OR_DEPARTMENT_OTHER): Payer: Managed Care, Other (non HMO)

## 2022-11-13 ENCOUNTER — Other Ambulatory Visit: Payer: Self-pay

## 2022-11-13 ENCOUNTER — Encounter (HOSPITAL_BASED_OUTPATIENT_CLINIC_OR_DEPARTMENT_OTHER): Payer: Self-pay | Admitting: Emergency Medicine

## 2022-11-13 ENCOUNTER — Emergency Department (HOSPITAL_BASED_OUTPATIENT_CLINIC_OR_DEPARTMENT_OTHER)
Admission: EM | Admit: 2022-11-13 | Discharge: 2022-11-13 | Disposition: A | Discharge status: Home / Self Care | Payer: Managed Care, Other (non HMO) | Attending: Emergency Medicine | Admitting: Emergency Medicine

## 2022-11-13 DIAGNOSIS — E876 Hypokalemia: Secondary | ICD-10-CM | POA: Insufficient documentation

## 2022-11-13 DIAGNOSIS — K529 Noninfective gastroenteritis and colitis, unspecified: Secondary | ICD-10-CM | POA: Diagnosis not present

## 2022-11-13 DIAGNOSIS — Z1152 Encounter for screening for COVID-19: Secondary | ICD-10-CM | POA: Insufficient documentation

## 2022-11-13 DIAGNOSIS — R739 Hyperglycemia, unspecified: Secondary | ICD-10-CM | POA: Diagnosis not present

## 2022-11-13 DIAGNOSIS — R7401 Elevation of levels of liver transaminase levels: Secondary | ICD-10-CM | POA: Diagnosis not present

## 2022-11-13 DIAGNOSIS — R1011 Right upper quadrant pain: Secondary | ICD-10-CM | POA: Diagnosis present

## 2022-11-13 LAB — URINALYSIS, ROUTINE W REFLEX MICROSCOPIC
Bilirubin Urine: NEGATIVE
Glucose, UA: NEGATIVE mg/dL
Hgb urine dipstick: NEGATIVE
Ketones, ur: NEGATIVE mg/dL
Leukocytes,Ua: NEGATIVE
Nitrite: NEGATIVE
Protein, ur: NEGATIVE mg/dL
Specific Gravity, Urine: 1.02 (ref 1.005–1.030)
pH: >=9 (ref 5.0–8.0)

## 2022-11-13 LAB — BASIC METABOLIC PANEL
Anion gap: 8 (ref 5–15)
BUN: 16 mg/dL (ref 6–20)
CO2: 32 mmol/L (ref 22–32)
Calcium: 8.8 mg/dL — ABNORMAL LOW (ref 8.9–10.3)
Chloride: 97 mmol/L — ABNORMAL LOW (ref 98–111)
Creatinine, Ser: 0.79 mg/dL (ref 0.61–1.24)
GFR, Estimated: >60 mL/min (ref >60)
Glucose, Bld: 164 mg/dL — ABNORMAL HIGH (ref 70–99)
Potassium: 2.9 mmol/L — ABNORMAL LOW (ref 3.5–5.1)
Sodium: 137 mmol/L (ref 135–145)

## 2022-11-13 LAB — CBC WITH DIFFERENTIAL/PLATELET
Abs Immature Granulocytes: 0.03 10*3/uL (ref 0.00–0.07)
Basophils Absolute: 0.1 10*3/uL (ref 0.0–0.1)
Basophils Relative: 1 %
Eosinophils Absolute: 0.2 10*3/uL (ref 0.0–0.5)
Eosinophils Relative: 2 %
HCT: 40 % (ref 39.0–52.0)
Hemoglobin: 14.1 g/dL (ref 13.0–17.0)
Immature Granulocytes: 0 %
Lymphocytes Relative: 12 %
Lymphs Abs: 1.2 10*3/uL (ref 0.7–4.0)
MCH: 31 pg (ref 26.0–34.0)
MCHC: 35.3 g/dL (ref 30.0–36.0)
MCV: 87.9 fL (ref 80.0–100.0)
Monocytes Absolute: 0.8 10*3/uL (ref 0.1–1.0)
Monocytes Relative: 8 %
Neutro Abs: 7.6 10*3/uL (ref 1.7–7.7)
Neutrophils Relative %: 77 %
Platelets: 275 10*3/uL (ref 150–400)
RBC: 4.55 MIL/uL (ref 4.22–5.81)
RDW: 12.3 % (ref 11.5–15.5)
WBC: 9.8 10*3/uL (ref 4.0–10.5)
nRBC: 0 % (ref 0.0–0.2)

## 2022-11-13 LAB — HEPATIC FUNCTION PANEL
ALT: 48 U/L — ABNORMAL HIGH (ref 0–44)
AST: 31 U/L (ref 15–41)
Albumin: 3.7 g/dL (ref 3.5–5.0)
Alkaline Phosphatase: 44 U/L (ref 38–126)
Bilirubin, Direct: <0.1 mg/dL (ref 0.0–0.2)
Total Bilirubin: 0.5 mg/dL (ref 0.3–1.2)
Total Protein: 7.1 g/dL (ref 6.5–8.1)

## 2022-11-13 LAB — RESP PANEL BY RT-PCR (RSV, FLU A&B, COVID)  RVPGX2
Influenza A by PCR: NEGATIVE
Influenza B by PCR: NEGATIVE
Resp Syncytial Virus by PCR: NEGATIVE
SARS Coronavirus 2 by RT PCR: NEGATIVE

## 2022-11-13 LAB — LIPASE, BLOOD: Lipase: 34 U/L (ref 11–51)

## 2022-11-13 LAB — MAGNESIUM: Magnesium: 1.7 mg/dL (ref 1.7–2.4)

## 2022-11-13 MED ORDER — LIDOCAINE VISCOUS HCL 2 % MT SOLN
15.0000 mL | Freq: Once | OROMUCOSAL | Status: AC
  Administered 2022-11-13: 15 mL via ORAL
  Filled 2022-11-13: qty 15

## 2022-11-13 MED ORDER — SODIUM CHLORIDE 0.9 % IV BOLUS
1000.0000 mL | Freq: Once | INTRAVENOUS | Status: AC
  Administered 2022-11-13: 1000 mL via INTRAVENOUS

## 2022-11-13 MED ORDER — ONDANSETRON 4 MG PO TBDP: 4.0000 mg | ORAL_TABLET | Freq: Three times a day (TID) | ORAL | 0 refills | Status: AC | PRN

## 2022-11-13 MED ORDER — FAMOTIDINE IN NACL 20-0.9 MG/50ML-% IV SOLN
20.0000 mg | Freq: Once | INTRAVENOUS | Status: AC
  Administered 2022-11-13: 20 mg via INTRAVENOUS
  Filled 2022-11-13: qty 50

## 2022-11-13 MED ORDER — SODIUM CHLORIDE 0.9 % IV SOLN: INTRAVENOUS | Status: DC

## 2022-11-13 MED ORDER — ONDANSETRON HCL 4 MG/2ML IJ SOLN
4.0000 mg | Freq: Once | INTRAMUSCULAR | Status: AC
  Administered 2022-11-13: 4 mg via INTRAVENOUS
  Filled 2022-11-13: qty 2

## 2022-11-13 MED ORDER — FENTANYL CITRATE PF 50 MCG/ML IJ SOSY
50.0000 ug | PREFILLED_SYRINGE | Freq: Once | INTRAMUSCULAR | Status: AC
  Administered 2022-11-13: 50 ug via INTRAVENOUS
  Filled 2022-11-13: qty 1

## 2022-11-13 MED ORDER — FAMOTIDINE 20 MG PO TABS: 20.0000 mg | ORAL_TABLET | Freq: Two times a day (BID) | ORAL | 0 refills | Status: DC

## 2022-11-13 MED ORDER — POTASSIUM CHLORIDE 10 MEQ/100ML IV SOLN
10.0000 meq | INTRAVENOUS | Status: DC
  Administered 2022-11-13 (×2): 10 meq via INTRAVENOUS
  Filled 2022-11-13 (×2): qty 100

## 2022-11-13 MED ORDER — POTASSIUM CHLORIDE CRYS ER 20 MEQ PO TBCR
40.0000 meq | EXTENDED_RELEASE_TABLET | Freq: Once | ORAL | Status: AC
  Administered 2022-11-13: 40 meq via ORAL
  Filled 2022-11-13: qty 2

## 2022-11-13 MED ORDER — DIPHENHYDRAMINE HCL 50 MG/ML IJ SOLN
50.0000 mg | Freq: Once | INTRAMUSCULAR | Status: AC
  Administered 2022-11-13: 50 mg via INTRAVENOUS
  Filled 2022-11-13: qty 1

## 2022-11-13 MED ORDER — IOHEXOL 300 MG/ML  SOLN
100.0000 mL | Freq: Once | INTRAMUSCULAR | Status: AC | PRN
  Administered 2022-11-13: 100 mL via INTRAVENOUS

## 2022-11-13 MED ORDER — PANTOPRAZOLE SODIUM 40 MG PO TBEC: 40.0000 mg | DELAYED_RELEASE_TABLET | Freq: Every day | ORAL | 0 refills | Status: DC

## 2022-11-13 MED ORDER — ALUM & MAG HYDROXIDE-SIMETH 200-200-20 MG/5ML PO SUSP
30.0000 mL | Freq: Once | ORAL | Status: AC
  Administered 2022-11-13: 30 mL via ORAL
  Filled 2022-11-13: qty 30

## 2022-11-13 NOTE — ED Provider Notes (Signed)
[facility] EMERGENCY DEPARTMENT Provider Note   CSN: [id number] Arrival date & time: 11/13/22  0608     History  Chief Complaint  Patient presents with   Abdominal Pain    Martin [name] is a 52 y.o. male.   Abdominal Pain Associated symptoms: nausea and vomiting      52 year old male with medical history significant for H. pylori infection status posttreatment, history of stomach ulcers, GERD who presents to the emergency department with right upper quadrant pain radiating to his back.  The patient states that the pain started yesterday afternoon.  He has had 1 episode of nonbloody nonbilious vomiting.  He is passing gas.  He has had several episodes of loose stool.  He denies any fevers.  He denies any chills.  He denies any history of gallstones or biliary disease.  Pain has persisted and is severe with associated nausea and so he presents to the emergency department for further evaluation.  Home Medications Prior to Admission medications   Medication Sig Start Date End Date Taking? Authorizing Provider  famotidine (PEPCID) 20 MG tablet Take 1 tablet (20 mg total) by mouth 2 (two) times daily. 11/13/22  Yes Regan Lemming, MD  ondansetron (ZOFRAN-ODT) 4 MG disintegrating tablet Take 1 tablet (4 mg total) by mouth every 8 (eight) hours as needed for nausea or vomiting. 11/13/22  Yes Regan Lemming, MD  pantoprazole (PROTONIX) 40 MG tablet Take 1 tablet (40 mg total) by mouth daily. 11/13/22 12/13/22 Yes Regan Lemming, MD  amphetamine-dextroamphetamine (ADDERALL) 10 MG tablet Take 10 mg by mouth 2 (two) times daily with a meal.    [provider]  EPINEPHrine 0.3 mg/0.3 mL IJ SOAJ injection Inject 0.3 mg into the muscle as needed for anaphylaxis. 02/12/22   Truddie Hidden, MD  hydrochlorothiazide (HYDRODIURIL) 25 MG tablet Take 25 mg by mouth daily. 10/31/22   [provider]  olmesartan (BENICAR) 40 MG tablet Take 40 mg by mouth daily. 10/31/22   [provider]  rosuvastatin (CRESTOR) 5 MG tablet Take 5 mg by mouth daily. 10/31/22   [provider]  zolpidem (AMBIEN) 10 MG tablet Take 10 mg by mouth at bedtime as needed for sleep.    [provider]      Allergies    Iodinated contrast media    Review of Systems   Review of Systems  Gastrointestinal:  Positive for abdominal pain, nausea and vomiting.  All other systems reviewed and are negative.   Physical Exam Updated Vital Signs BP 126/89 (BP Location: Left Arm)   Pulse 74   Temp 97.6 F (36.4 C)   Resp 20   Ht '6\' 2"'$  (1.88 m)   Wt 111.1 kg   SpO2 96%   BMI 31.46 kg/m  Physical Exam Vitals and nursing note reviewed.  Constitutional:      General: He is not in acute distress.    Appearance: He is well-developed.  HENT:     Head: Normocephalic and atraumatic.  Eyes:     Conjunctiva/sclera: Conjunctivae normal.  Cardiovascular:     Rate and Rhythm: Normal rate and regular rhythm.  Pulmonary:     Effort: Pulmonary effort is normal. No respiratory distress.     Breath sounds: Normal breath sounds.  Abdominal:     Palpations: Abdomen is soft.     Tenderness: There is abdominal tenderness in the right upper quadrant. There is guarding. Positive signs include Murphy's sign.  Musculoskeletal:  General: No swelling.     Cervical back: Neck supple.  Skin:    General: Skin is warm and dry.     Capillary Refill: Capillary refill takes less than 2 seconds.  Neurological:     Mental Status: He is alert.  Psychiatric:        Mood and Affect: Mood normal.     ED Results / Procedures / Treatments   Labs (all labs ordered are listed, but only abnormal results are displayed) Labs Reviewed  HEPATIC FUNCTION PANEL - Abnormal; Notable for the following components:      Result Value   ALT 48 (*)    All other components within normal limits  BASIC METABOLIC PANEL - Abnormal; Notable for the following components:   Potassium 2.9 (*)    Chloride  97 (*)    Glucose, Bld 164 (*)    Calcium 8.8 (*)    All other components within normal limits  RESP PANEL BY RT-PCR (RSV, FLU A&B, COVID)  RVPGX2  LIPASE, BLOOD  CBC WITH DIFFERENTIAL/PLATELET  URINALYSIS, ROUTINE W REFLEX MICROSCOPIC  MAGNESIUM    EKG EKG Interpretation  Date/Time:  Tuesday November 13 2022 08:09:22 EST Ventricular Rate:  81 PR Interval:  156 QRS Duration: 110 QT Interval:  384 QTC Calculation: 446 R Axis:   -28 Text Interpretation: Sinus rhythm Abnormal R-wave progression, early transition Left ventricular hypertrophy Confirmed by Regan Lemming ([id number]) on 11/13/2022 8:35:18 AM  Radiology CT ABDOMEN PELVIS W CONTRAST  Result Date: 11/13/2022 CLINICAL DATA:  Abdominal pain, RIGHT upper abdominal pain starting last night, vomiting. EXAM: CT ABDOMEN AND PELVIS WITH CONTRAST TECHNIQUE: Multidetector CT imaging of the abdomen and pelvis was performed using the standard protocol following bolus administration of intravenous contrast. RADIATION DOSE REDUCTION: This exam was performed according to the departmental dose-optimization program which includes automated exposure control, adjustment of the mA and/or kV according to patient size and/or use of iterative reconstruction technique. CONTRAST:  159m OMNIPAQUE IOHEXOL 300 MG/ML  SOLN COMPARISON:  None Available. FINDINGS: Lower chest: No acute abnormality. Hepatobiliary: Liver is diffusely low in density indicating fatty infiltration. Focal fatty sparing noted at the gallbladder fossa region. No acute findings. Gallbladder appears normal. No bile duct dilatation is seen. Pancreas: Unremarkable. No pancreatic ductal dilatation or surrounding inflammatory changes. Spleen: Normal in size without focal abnormality. Adrenals/Urinary Tract: Adrenal glands appear normal. Kidneys are unremarkable without suspicious mass, stone or hydronephrosis. No perinephric fluid. No ureteral or bladder calculi identified. Bladder appears normal,  partially decompressed. Stomach/Bowel: No dilated large or small bowel loops. Appendix is normal. Fluid is seen throughout a significant portion of the nondistended small bowel. Extensive diverticulosis is seen within the sigmoid and descending colon but no focal inflammatory changes seen to suggest acute diverticulitis. Vascular/Lymphatic: Aortic atherosclerosis. No abdominal aortic aneurysm. No acute-appearing vascular abnormality. No enlarged lymph nodes are seen in the abdomen or pelvis. Reproductive: Prostate is unremarkable. Other: No free fluid or abscess collection is seen. No free intraperitoneal air. Musculoskeletal: Mild degenerative spondylosis within the lower lumbar spine. No acute-appearing osseous abnormality. IMPRESSION: 1. Fluid is seen throughout a significant portion of the nondistended small bowel. This is a nonspecific finding but can be seen in the setting of a mild enteritis. 2. Colonic diverticulosis without evidence of acute diverticulitis. 3. Fatty infiltration of the liver. Aortic Atherosclerosis (ICD10-I70.0). Electronically Signed   By: SFranki CabotM.D.   On: 11/13/2022 10:23   [place]domen Limited RUQ (LIVER/GB)  Result Date: 11/13/2022 CLINICAL DATA:  Postprandial right upper quadrant pain EXAM: ULTRASOUND ABDOMEN LIMITED RIGHT UPPER QUADRANT COMPARISON:  None available FINDINGS: Gallbladder: Gallstones: None Sludge: Present Gallbladder Wall: Within normal limits Pericholecystic fluid: None Sonographic Murphy's Sign: Negative per technologist Common bile duct: Diameter: 4 mm Liver: Parenchymal echogenicity: Diffusely increased Contours: Normal Lesions: None Portal vein: Patent.  Hepatopetal flow Other: None. IMPRESSION: 1. Diffuse increased echogenicity of the hepatic parenchyma is a nonspecific indicator of hepatocellular dysfunction, most commonly steatosis. 2. Gallbladder sludge without additional evidence of cholecystitis. Electronically Signed   By: Miachel Roux M.D.   On:  11/13/2022 07:53    Procedures Procedures    Medications Ordered in ED Medications  sodium chloride 0.9 % bolus 1,000 mL (0 mLs Intravenous Stopped 11/13/22 0853)  fentaNYL (SUBLIMAZE) injection 50 mcg (50 mcg Intravenous Given 11/13/22 0736)  ondansetron (ZOFRAN) injection 4 mg (4 mg Intravenous Given 11/13/22 0736)  famotidine (PEPCID) IVPB 20 mg premix (0 mg Intravenous Stopped 11/13/22 0905)  fentaNYL (SUBLIMAZE) injection 50 mcg (50 mcg Intravenous Given 11/13/22 0948)  iohexol (OMNIPAQUE) 300 MG/ML solution 100 mL (100 mLs Intravenous Contrast Given 11/13/22 1002)  diphenhydrAMINE (BENADRYL) injection 50 mg (50 mg Intravenous Given 11/13/22 1024)  potassium chloride SA (KLOR-CON M) CR tablet 40 mEq (40 mEq Oral Given 11/13/22 1128)  alum & mag hydroxide-simeth (MAALOX/MYLANTA) 200-200-20 MG/5ML suspension 30 mL (30 mLs Oral Given 11/13/22 1127)    And  lidocaine (XYLOCAINE) 2 % viscous mouth solution 15 mL (15 mLs Oral Given 11/13/22 1128)    ED Course/ Medical Decision Making/ A&P                             Medical Decision Making Amount and/or Complexity of Data Reviewed Labs: ordered. Radiology: ordered.  Risk OTC drugs. Prescription drug management.    53 year old male with medical history significant for H. pylori infection status posttreatment, history of stomach ulcers, GERD who presents to the emergency department with right upper quadrant pain radiating to his back.  The patient states that the pain started yesterday afternoon.  He has had 1 episode of nonbloody nonbilious vomiting.  He is passing gas.  He has had several episodes of loose stool.  He denies any fevers.  He denies any chills.  He denies any history of gallstones or biliary disease.  Pain has persisted and is severe with associated nausea and so he presents to the emergency department for further evaluation.  On arrival, the patient was afebrile, temperature 97.6, not tachycardic or tachypneic, heart rate 84,  BP 123/80, saturating 96% on room air.  Sinus rhythm noted on cardiac telemetry.  Physical exam concerning for right upper quadrant tenderness to palpation with a positive Murphy sign.  No CVA tenderness noted. Minimal guarding present.  Differential diagnosis includes cholelithiasis/cholecystitis, pancreatitis, peptic ulcer disease, perforated ulcer, GERD/gastritis/duodenitis.  Lower suspicion for small bowel obstruction, appendicitis, diverticulitis, nephrolithiasis/pyelonephritis.  IV access was obtained and the patient was administered an IV fluid bolus, IV Zofran, IV fentanyl for pain control and nausea control and volume resuscitation.  Labs: CBC without a leukocytosis or anemia, hepatic function panel with mildly elevated ALT, otherwise unremarkable, no biliary dysfunction, BMP with hypokalemia to 2.9, replenished IV.  Patient was also administered oral replenishment.  His magnesium was normal at 1.7.  He was mildly hyperglycemic to 164.  COVID-19, influenza, RSV PCR testing was collected and resulted negative, lipase was normal and UA was unremarkable.  Imaging: RUQ [place]: IMPRESSION:  1. Diffuse increased echogenicity of the hepatic parenchyma is a  nonspecific indicator of hepatocellular dysfunction, most commonly  steatosis.  2. Gallbladder sludge without additional evidence of cholecystitis.   Given the patient's gallbladder findings on ultrasound, will proceed with CT of the abdomen pelvis given his abdominal pain.  CT Abdomen Pelvis: IMPRESSION:  1. Fluid is seen throughout a significant portion of the  nondistended small bowel. This is a nonspecific finding but can be  seen in the setting of a mild enteritis.  2. Colonic diverticulosis without evidence of acute diverticulitis.  3. Fatty infiltration of the liver.    Aortic Atherosclerosis (ICD10-I70.0).    On repeat assessment, patient's abdominal tenderness had significantly improved.  He was tolerating oral intake.  Low  suspicion for acute cholecystitis.  The patient is afebrile, without a leukocytosis, without significant biliary dysfunction on laboratory evaluation.  His gallbladder was notably normal on CT and with only slight sludge without evidence of cholecystitis on ultrasound.  CT is consistent with a colitis.  He has had no bloody diarrhea.  He is overall well-appearing and tolerating oral intake.  He was symptomatically well-appearing status post the above interventions.  Patient symptoms are consistent with likely gastroenteritis.  He could have underlying peptic ulcer disease and/or GERD and gastritis which has been exacerbating his symptoms.  He does have a history of peptic ulcers.  He is overall well-appearing with a reassuring workup here in the emergency department.  He appears well-hydrated and fluid resuscitated.  I did advise that the patient continue to manage his symptoms at home with a prescription for Zofran, Pepcid and Protonix, follow-up outpatient with his PCP with consideration for GI referral as needed.   Final Clinical Impression(s) / ED Diagnoses Final diagnoses:  Gastroenteritis  Hypokalemia    Rx / DC Orders ED Discharge Orders          Ordered    ondansetron (ZOFRAN-ODT) 4 MG disintegrating tablet  Every 8 hours PRN        11/13/22 1152    famotidine (PEPCID) 20 MG tablet  2 times daily        11/13/22 1152    pantoprazole (PROTONIX) 40 MG tablet  Daily        11/13/22 1152              Regan Lemming, MD 11/14/22 1015

## 2022-11-13 NOTE — ED Notes (Signed)
Presents with RUQ pain with radiation to back, pain intermittent since yesterday. Denies fevers, had several light stool BM. Vomited yesterday. Positive Percell Miller Sign upon examination by ED MD. Orders rec and implemented

## 2022-11-13 NOTE — Discharge Instructions (Addendum)
Your potassium was low which could be found in the setting of nausea and vomiting and diarrhea as you lose potassium through the GI tract.  You have a history of ulceration and symptoms could be due to gastric irritation such as gastritis in the setting of a gastroenteritis.  Your CT imaging and ultrasound imaging was overall reassuring.  He had an allergic reaction to IV contrast.  This allergy has been added to your chart.  It resolved with Benadryl.  Your ultrasound revealed the following: IMPRESSION:  1. Diffuse increased echogenicity of the hepatic parenchyma is a  nonspecific indicator of hepatocellular dysfunction, most commonly  steatosis.  2. Gallbladder sludge without additional evidence of cholecystitis.    CT Abdomen Pelvis: IMPRESSION:  1. Fluid is seen throughout a significant portion of the  nondistended small bowel. This is a nonspecific finding but can be  seen in the setting of a mild enteritis.  2. Colonic diverticulosis without evidence of acute diverticulitis.  3. Fatty infiltration of the liver.    Aortic Atherosclerosis (ICD10-I70.0).

## 2022-11-13 NOTE — ED Notes (Signed)
Lab phoned to add Mg Level to lab results

## 2022-11-13 NOTE — ED Notes (Signed)
Instructed to remain NPO until further orders

## 2022-11-13 NOTE — ED Notes (Signed)
Per ED MD, will not administer any further IVPB Potassium. Order changed to PO (client rec 2 10MeQ IVPB of Potassium)

## 2022-11-13 NOTE — ED Triage Notes (Signed)
Pt is c/o right upper abd pain that started last night  Pt states he had 2 episode of vomiting  Pt states pain radiates into his back

## 2022-11-13 NOTE — ED Notes (Signed)
PO fluid challenge initiated

## 2022-12-06 ENCOUNTER — Ambulatory Visit: Payer: Managed Care, Other (non HMO) | Admitting: Physician Assistant

## 2022-12-06 ENCOUNTER — Encounter: Payer: Self-pay | Admitting: Physician Assistant

## 2022-12-06 VITALS — BP 120/88 | HR 113 | Ht 74.0 in | Wt 244.0 lb

## 2022-12-06 DIAGNOSIS — K219 Gastro-esophageal reflux disease without esophagitis: Secondary | ICD-10-CM

## 2022-12-06 DIAGNOSIS — R110 Nausea: Secondary | ICD-10-CM | POA: Diagnosis not present

## 2022-12-06 DIAGNOSIS — K828 Other specified diseases of gallbladder: Secondary | ICD-10-CM

## 2022-12-06 DIAGNOSIS — R1013 Epigastric pain: Secondary | ICD-10-CM | POA: Diagnosis not present

## 2022-12-06 DIAGNOSIS — R935 Abnormal findings on diagnostic imaging of other abdominal regions, including retroperitoneum: Secondary | ICD-10-CM | POA: Diagnosis not present

## 2022-12-06 MED ORDER — FAMOTIDINE 20 MG PO TABS: 20.0000 mg | ORAL_TABLET | Freq: Two times a day (BID) | ORAL | 3 refills | Status: AC

## 2022-12-06 MED ORDER — PANTOPRAZOLE SODIUM 40 MG PO TBEC: 40.0000 mg | DELAYED_RELEASE_TABLET | Freq: Two times a day (BID) | ORAL | 3 refills | Status: AC

## 2022-12-06 NOTE — Patient Instructions (Addendum)
If you are age 52 or older, your body mass index should be between 23-30. Your Body mass index is 31.33 kg/m. If this is out of the aforementioned range listed, please consider follow up with your Primary Care Provider.  If you are age 37 or younger, your body mass index should be between 19-25. Your Body mass index is 31.33 kg/m. If this is out of the aformentioned range listed, please consider follow up with your Primary Care Provider.   You have been scheduled for an endoscopy. Please follow written instructions given to you at your visit today. If you use inhalers (even only as needed), please bring them with you on the day of your procedure.  We have sent the following medications to your pharmacy for you to pick up at your convenience: Pantoprazole 40 mg 30-60 minutes before breakfast and dinner. Pepcid 20 mg take one tablet in the morning and one at bedtime.  The [facility] providers would like to encourage you to use [facility] to communicate with providers for non-urgent requests or questions.  Due to long hold times on the telephone, sending your provider a message by [employer] may be a faster and more efficient way to get a response.  Please allow 48 business hours for a response.  Please remember that this is for non-urgent requests.   It was a pleasure to see you today!  Thank you for trusting me with your gastrointestinal care!    Ellouise Newer, PA-C

## 2022-12-06 NOTE — Progress Notes (Signed)
I agree with the assessment and plan as outlined by Ms. Lemmon. 

## 2022-12-06 NOTE — Progress Notes (Signed)
Chief Complaint: Follow up ER visit for gastroenteritis  HPI:     [name] is a  52 y/o male with a past medical history as listed below including IDA, previous H. pylori infection, hiatal hernia and tubular adenomas, known to Dr. Hilarie Fredrickson, who was referred to me by Merrilee Seashore, MD for follow-up after being seen in the ER for gastroenteritis.    11/25/2017 EGD with LA grade B reflux esophagitis, 2 cm hiatal hernia and gastritis.    02/22/2021 colonoscopy with a 2 mm polyp removed from the cecum, 3 to-6 mm polyps removed from the ascending colon, two 5-10 mm polyps removed from the transverse colon and one 3 mm polyp removed from the sigmoid colon as well as diverticulosis in the sigmoid and descending colon small internal hemorrhoids.  Pathology showed tubular adenomas.  Repeat recommended in 3 years.    11/13/2022 patient presented to the ER with right upper quadrant pain radiating to his back that it started the day before with vomiting.  Also some diarrhea.  Hepatic function panel showed ALT of 48, BMP with a potassium of 2.9.  Lipase normal and CBC otherwise normal.  CT abdomen pelvis showed fluid throughout a significant portion of the nondistended small bowel which was nonspecific but could be seen in the setting of a mild enteritis, colonic diverticulosis without evidence of acute diverticulitis and fatty infiltration of the liver.  Right upper quadrant ultrasound showed diffuse increased echogenicity of the hepatic parenchyma and gallbladder sludge without additional evidence of cholecystitis.  Patient was given IV fluids, Zofran and fentanyl.  He improved.  At that time he was sent home with a prescription for Zofran, Pepcid 20 mg twice daily and Protonix 40 mg daily and told to follow-up with [place].    Today, the patient presents to clinic accompanied by his fiance.  He tells me he has always had chronic abdominal pain even since he first started seeing Dr. Hilarie Fredrickson back in 2019 and no one has  ever really taken care of it.  Most recently he noticed that this pain was somewhat radiating over to his right quadrant and into his back.  Tells me that he has pain really within 5 minutes of eating anything and currently cannot eat much and is on a very bland diet.  Otherwise he develops nausea and will feel like he has to go to the bathroom to have diarrhea but cannot.  Tells me that the pain lasts for sometimes up to 2 hours after eating and at its worst is a 7/10 and then will decrease to a constant 3-4/10.  He has tried [employer] and Tums which really do not help that much.  He remains on Pantoprazole 40 mg every morning and has done this for years, not sure if it is doing much.  Also added Pepcid 20 mg twice daily lately after being in the ER but does not feel like that made any change.  He has noted that certain foods tend to make his symptoms worse including greasy fatty fried stuff as well as red sauces.  Tells me he is aggravated that no one has ever figured out what is causing his symptoms.  Thinks there may be a link between stress/anxiety and his pain.    Patient's fianc really feels like this could be gallbladder because her mother went through the same sort of thing.    Denies fever, chills, weight loss, blood in his stool or symptoms that awaken him from sleep.  Past Medical History:  Diagnosis Date   Diverticulosis    H. pylori infection    Hiatal hernia    Hypertension    Iron deficiency anemia    Personal history of colonic polyps    Tubular adenoma of colon    Varicose veins 2016   Left Leg    Past Surgical History:  Procedure Laterality Date   COLONOSCOPY  2019   UPPER GASTROINTESTINAL ENDOSCOPY  2019   VASECTOMY      Current Outpatient Medications  Medication Sig Dispense Refill   amphetamine-dextroamphetamine (ADDERALL) 10 MG tablet Take 10 mg by mouth 2 (two) times daily with a meal.     EPINEPHrine 0.3 mg/0.3 mL IJ SOAJ injection Inject 0.3 mg into the muscle as  needed for anaphylaxis. 1 each 0   famotidine (PEPCID) 20 MG tablet Take 1 tablet (20 mg total) by mouth 2 (two) times daily. 30 tablet 0   hydrochlorothiazide (HYDRODIURIL) 25 MG tablet Take 25 mg by mouth daily.     olmesartan (BENICAR) 40 MG tablet Take 40 mg by mouth daily.     ondansetron (ZOFRAN-ODT) 4 MG disintegrating tablet Take 1 tablet (4 mg total) by mouth every 8 (eight) hours as needed for nausea or vomiting. 20 tablet 0   pantoprazole (PROTONIX) 40 MG tablet Take 1 tablet (40 mg total) by mouth daily. 30 tablet 0   rosuvastatin (CRESTOR) 5 MG tablet Take 5 mg by mouth daily.     zolpidem (AMBIEN) 10 MG tablet Take 10 mg by mouth at bedtime as needed for sleep.     No current facility-administered medications for this visit.    Allergies as of 12/06/2022 - Review Complete 11/13/2022  Allergen Reaction Noted   Iodinated contrast media Hives and Itching 11/13/2022    Family History  Problem Relation Age of Onset   Diabetes Mother    COPD Father    Diabetes Father    Hypertension Father    Hypertension Brother    Colon polyps Brother    Cancer Cousin        metastatic, d. 17   Colon cancer Neg Hx    Esophageal cancer Neg Hx    Stomach cancer Neg Hx     Social History   Socioeconomic History   Marital status: Divorced    Spouse name: Not on file   Number of children: Not on file   Years of education: Not on file   Highest education level: Not on file  Occupational History   Not on file  Tobacco Use   Smoking status: Former    Packs/day: 0.50    Years: 4.00    Total pack years: 2.00    Types: Cigarettes    Quit date: 04/28/2012    Years since quitting: 10.6   Smokeless tobacco: Never  Vaping Use   Vaping Use: Some days   Substances: Nicotine  Substance and Sexual Activity   Alcohol use: Yes    Alcohol/week: 14.0 standard drinks of alcohol    Types: 14 Glasses of wine per week   Drug use: No   Sexual activity: Yes  Other Topics Concern   Not on file   Social History Narrative   Not on file   Social Determinants of Health   Financial Resource Strain: Not on file  Food Insecurity: Not on file  Transportation Needs: Not on file  Physical Activity: Not on file  Stress: Not on file  Social Connections: Not on file  Intimate  Partner Violence: Not on file    Review of Systems:    Constitutional: No weight loss, fever or chills Cardiovascular: No chest pain Respiratory: No SOB Gastrointestinal: See HPI and otherwise negative Genitourinary: No dysuria Neurological: No headache, dizziness or syncope Musculoskeletal: No new muscle or joint pain Hematologic: No bleeding  Psychiatric: +anxiety   Physical Exam:  Vital signs: BP 120/88   Pulse (!) 113   Ht '6\' 2"'$  (1.88 m)   Wt 244 lb (110.7 kg)   SpO2 98%   BMI 31.33 kg/m    Constitutional:   Pleasant Caucasian male appears to be in NAD, Well developed, Well nourished, alert and cooperative Head:  Normocephalic and atraumatic. Eyes:   PEERL, EOMI. No icterus. Conjunctiva pink. Ears:  Normal auditory acuity. Neck:  Supple Throat: Oral cavity and pharynx without inflammation, swelling or lesion.  Respiratory: Respirations even and unlabored. Lungs clear to auscultation bilaterally.   No wheezes, crackles, or rhonchi.  Cardiovascular: Normal S1, S2. No MRG. Regular rate and rhythm. No peripheral edema, cyanosis or pallor.  Gastrointestinal:  Soft, nondistended, mild-moderate epigastric ttp. No rebound or guarding. Normal bowel sounds. No appreciable masses or hepatomegaly. Rectal:  Not performed.  Msk:  Symmetrical without gross deformities. Without edema, no deformity or joint abnormality.  Neurologic:  Alert and  oriented x4;  grossly normal neurologically.  Skin:   Dry and intact without significant lesions or rashes. Psychiatric: Demonstrates good judgement and reason without abnormal affect or behaviors.  RELEVANT LABS AND IMAGING: CBC    Component Value Date/Time   WBC  9.8 11/13/2022 0726   RBC 4.55 11/13/2022 0726   HGB 14.1 11/13/2022 0726   HCT 40.0 11/13/2022 0726   PLT 275 11/13/2022 0726   MCV 87.9 11/13/2022 0726   MCH 31.0 11/13/2022 0726   MCHC 35.3 11/13/2022 0726   RDW 12.3 11/13/2022 0726   LYMPHSABS 1.2 11/13/2022 0726   MONOABS 0.8 11/13/2022 0726   EOSABS 0.2 11/13/2022 0726   BASOSABS 0.1 11/13/2022 0726    CMP     Component Value Date/Time   NA 137 11/13/2022 0726   K 2.9 (L) 11/13/2022 0726   CL 97 (L) 11/13/2022 0726   CO2 32 11/13/2022 0726   GLUCOSE 164 (H) 11/13/2022 0726   BUN 16 11/13/2022 0726   CREATININE 0.79 11/13/2022 0726   CALCIUM 8.8 (L) 11/13/2022 0726   PROT 7.1 11/13/2022 0726   ALBUMIN 3.7 11/13/2022 0726   AST 31 11/13/2022 0726   ALT 48 (H) 11/13/2022 0726   ALKPHOS 44 11/13/2022 0726   BILITOT 0.5 11/13/2022 0726   GFRNONAA >60 11/13/2022 0726    Assessment: 1.  Epigastric pain: Chronic for the patient with recent increase which sent him to the ER with imaging questioning a small bowel enteritis, he was treated for gastritis with Pepcid and Pantoprazole, no real change in symptoms, last EGD in 2019 with gastritis; consider continued gastritis versus functional dyspepsia versus PUD versus other 2.  Right upper quadrant pain: Ultrasound showing gallbladder sludge, could be contributing to symptoms 3.  History of gallbladder sludge 4.  Nausea/GERD  Plan: 1.  Scheduled patient for an EGD for diagnostic reasons given ongoing pain with Dr. [name] in the [facility] as she had a sooner appointment.  Patient will continue to follow with Dr. Hilarie Fredrickson after time procedure is his primary GI physician here.  Did provide the patient a detailed list of risks for the procedure and he agrees to proceed. Patient is appropriate for  endoscopic procedure(s) in the ambulatory ([place]) setting.  2.  Would recommend we increase patient's Pantoprazole to 40 mg twice daily, 30-60 minutes before breakfast and dinner.  Prescribed #60 with  5 refills. 3.  Continue Pepcid 20 mg twice daily, every morning and nightly #60 with 5 refills. 4.  Discussed with patient and his fiance that if EGD is unrevealing then would recommend a HIDA scan with [facility] for further evaluation of gallbladder function.  Could also consider an enterography given abnormal CT. 5.  Patient to follow in clinic per recommendations after time of procedures/imaging as above.  Ellouise Newer, PA-C [facility] 12/06/2022, 10:00 AM  Cc: Merrilee Seashore, MD

## 2022-12-12 ENCOUNTER — Ambulatory Visit (AMBULATORY_SURGERY_CENTER): Payer: Managed Care, Other (non HMO) | Admitting: Internal Medicine

## 2022-12-12 ENCOUNTER — Encounter: Payer: Self-pay | Admitting: Internal Medicine

## 2022-12-12 VITALS — BP 97/54 | HR 76 | Temp 98.6°F | Resp 14 | Ht 74.0 in | Wt 244.0 lb

## 2022-12-12 DIAGNOSIS — K2270 Barrett's esophagus without dysplasia: Secondary | ICD-10-CM

## 2022-12-12 DIAGNOSIS — R1013 Epigastric pain: Secondary | ICD-10-CM

## 2022-12-12 DIAGNOSIS — K449 Diaphragmatic hernia without obstruction or gangrene: Secondary | ICD-10-CM

## 2022-12-12 DIAGNOSIS — K2970 Gastritis, unspecified, without bleeding: Secondary | ICD-10-CM

## 2022-12-12 DIAGNOSIS — K319 Disease of stomach and duodenum, unspecified: Secondary | ICD-10-CM | POA: Diagnosis not present

## 2022-12-12 MED ORDER — SUCRALFATE 1 GM/10ML PO SUSP: 1.0000 g | Freq: Four times a day (QID) | ORAL | 0 refills | Status: AC

## 2022-12-12 MED ORDER — SODIUM CHLORIDE 0.9 % IV SOLN: 500.0000 mL | Freq: Once | INTRAVENOUS | Status: DC

## 2022-12-12 NOTE — Progress Notes (Signed)
Pt's states no medical or surgical changes since previsit or office visit. 

## 2022-12-12 NOTE — Op Note (Addendum)
[facility] [facility] Patient Name: Martin [name] Procedure Date: 12/12/2022 8:52 AM MRN: [id number] Endoscopist: Georgian Co , , [id number] Age: 52 Referring MD:  Date of Birth: 1971/03/24 Gender: Male Account #: [id number] Procedure:                Upper GI endoscopy Indications:              Epigastric abdominal pain, Abnormal CT of the GI                            tract (possible enteritis), history of H pylori Medicines:                Monitored Anesthesia Care Procedure:                Pre-Anesthesia Assessment:                           - Prior to the procedure, a History and Physical                            was performed, and patient medications and                            allergies were reviewed. The patient's tolerance of                            previous anesthesia was also reviewed. The risks                            and benefits of the procedure and the sedation                            options and risks were discussed with the patient.                            All questions were answered, and informed consent                            was obtained. Prior Anticoagulants: The patient has                            taken no anticoagulant or antiplatelet agents. ASA                            Grade Assessment: II - A patient with mild systemic                            disease. After reviewing the risks and benefits,                            the patient was deemed in satisfactory condition to                            undergo the procedure.  After obtaining informed consent, the endoscope was                            passed under direct vision. Throughout the                            procedure, the patient's blood pressure, pulse, and                            oxygen saturations were monitored continuously. The                            Olympus Scope [id number] was introduced through the                             mouth, and advanced to the second part of duodenum.                            The upper GI endoscopy was accomplished without                            difficulty. The patient tolerated the procedure                            well. Scope In: Scope Out: Findings:                 Salmon-colored mucosa was present. The maximum                            longitudinal extent of these esophageal mucosal                            changes was 1 cm in length. Mucosa was biopsied                            with a cold forceps for histology. One specimen                            bottle was sent to pathology.                           A 2 cm hiatal hernia was present.                           Localized inflammation characterized by congestion                            (edema), erythema and granularity was found in the                            gastric body and in the gastric antrum. Biopsies                            were taken with  a cold forceps for histology.                           The examined duodenum was normal. Biopsies were                            taken with a cold forceps for histology. Complications:            No immediate complications. Estimated Blood Loss:     Estimated blood loss was minimal. 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 Biopsied.                           - 2 cm hiatal hernia.                           - Gastritis. Biopsied.                           - Normal examined duodenum. Biopsied. Recommendation:           - Discharge patient to home (with escort).                           - Await pathology results.                           - Okay to use sucralfate QID for 2 weeks in                            addition to PPI BID and famotidine 20 mg BID.                           - Plan to proceed with HIDA scan.                           - The findings and  recommendations were discussed                            with the patient.                           - Return to GI clinic in 4 weeks with Dr. Hilarie [name] or                            Martin [name] for follow up. Dr Georgian Co "Martin [name]" [name],  12/12/2022 9:11:53 AM

## 2022-12-12 NOTE — Progress Notes (Signed)
Per Dr. [name], HIDA needs to be ordered.  This was written on pt's report.

## 2022-12-12 NOTE — Progress Notes (Signed)
Called to room to assist during endoscopic procedure.  Patient ID and intended procedure confirmed with present staff. Received instructions for my participation in the procedure from the performing physician.  

## 2022-12-12 NOTE — Progress Notes (Signed)
GASTROENTEROLOGY PROCEDURE H&P NOTE   Primary Care Physician: Merrilee Seashore, MD    Reason for Procedure:   Epigastric ab pain  Plan:    EGD  Patient is appropriate for endoscopic procedure(s) in the ambulatory ([place]) setting.  The nature of the procedure, as well as the risks, benefits, and alternatives were carefully and thoroughly reviewed with the patient. Ample time for discussion and questions allowed. The patient understood, was satisfied, and agreed to proceed.     HPI: Martin [name] is a 52 y.o. male who presents for EGD for evaluation of epigastric ab pain .  Patient was most recently seen in the Gastroenterology Clinic on 12/06/22.  No interval change in medical history since that appointment. Please refer to that note for full details regarding GI history and clinical presentation.   Past Medical History:  Diagnosis Date   Diverticulosis    H. pylori infection    Hiatal hernia    Hypertension    Iron deficiency anemia    Personal history of colonic polyps    Tubular adenoma of colon    Varicose veins 2016   Left Leg    Past Surgical History:  Procedure Laterality Date   COLONOSCOPY  2019   UPPER GASTROINTESTINAL ENDOSCOPY  2019   VASECTOMY      Prior to Admission medications   Medication Sig Start Date End Date Taking? Authorizing Provider  amphetamine-dextroamphetamine (ADDERALL) 10 MG tablet Take 10 mg by mouth 2 (two) times daily with a meal.   Yes [provider]  famotidine (PEPCID) 20 MG tablet Take 1 tablet (20 mg total) by mouth 2 (two) times daily. Take one tablet in the morning and one tablet and bedtime. 12/06/22  Yes Levin Erp, PA  hydrochlorothiazide (HYDRODIURIL) 25 MG tablet Take 25 mg by mouth daily. 10/31/22  Yes [provider]  olmesartan (BENICAR) 40 MG tablet Take 40 mg by mouth daily. 10/31/22  Yes [provider]  pantoprazole (PROTONIX) 40 MG tablet Take 1 tablet (40 mg total) by mouth 2 (two)  times daily. Take one tablet 30-60 minutes before breakfast and dinner. 12/06/22 04/05/23 Yes Lemmon, Lavone Nian, PA  rosuvastatin (CRESTOR) 5 MG tablet Take 5 mg by mouth daily. 10/31/22  Yes [provider]  zolpidem (AMBIEN) 10 MG tablet Take 10 mg by mouth at bedtime as needed for sleep.   Yes [provider]  EPINEPHrine 0.3 mg/0.3 mL IJ SOAJ injection Inject 0.3 mg into the muscle as needed for anaphylaxis. 02/12/22   Truddie Hidden, MD  ondansetron (ZOFRAN-ODT) 4 MG disintegrating tablet Take 1 tablet (4 mg total) by mouth every 8 (eight) hours as needed for nausea or vomiting. 11/13/22   Regan Lemming, MD    Current Outpatient Medications  Medication Sig Dispense Refill   amphetamine-dextroamphetamine (ADDERALL) 10 MG tablet Take 10 mg by mouth 2 (two) times daily with a meal.     famotidine (PEPCID) 20 MG tablet Take 1 tablet (20 mg total) by mouth 2 (two) times daily. Take one tablet in the morning and one tablet and bedtime. 60 tablet 3   hydrochlorothiazide (HYDRODIURIL) 25 MG tablet Take 25 mg by mouth daily.     olmesartan (BENICAR) 40 MG tablet Take 40 mg by mouth daily.     pantoprazole (PROTONIX) 40 MG tablet Take 1 tablet (40 mg total) by mouth 2 (two) times daily. Take one tablet 30-60 minutes before breakfast and dinner. 60 tablet 3   rosuvastatin (CRESTOR)  5 MG tablet Take 5 mg by mouth daily.     zolpidem (AMBIEN) 10 MG tablet Take 10 mg by mouth at bedtime as needed for sleep.     EPINEPHrine 0.3 mg/0.3 mL IJ SOAJ injection Inject 0.3 mg into the muscle as needed for anaphylaxis. 1 each 0   ondansetron (ZOFRAN-ODT) 4 MG disintegrating tablet Take 1 tablet (4 mg total) by mouth every 8 (eight) hours as needed for nausea or vomiting. 20 tablet 0   Current Facility-Administered Medications  Medication Dose Route Frequency Provider Last Rate Last Admin   0.9 %  sodium chloride infusion  500 mL Intravenous Once Sharyn Creamer, MD        Allergies as of  12/12/2022 - Review Complete 12/12/2022  Allergen Reaction Noted   Iodinated contrast media Hives and Itching 11/13/2022    Family History  Problem Relation Age of Onset   Diabetes Mother    COPD Father    Diabetes Father    Hypertension Father    Hypertension Brother    Colon polyps Brother    Cancer Cousin        metastatic, d. 31   Colon cancer Neg Hx    Esophageal cancer Neg Hx    Stomach cancer Neg Hx     Social History   Socioeconomic History   Marital status: Divorced    Spouse name: Not on file   Number of children: Not on file   Years of education: Not on file   Highest education level: Not on file  Occupational History   Not on file  Tobacco Use   Smoking status: Former    Packs/day: 0.50    Years: 4.00    Total pack years: 2.00    Types: Cigarettes    Quit date: 04/28/2012    Years since quitting: 10.6   Smokeless tobacco: Never  Vaping Use   Vaping Use: Some days   Substances: Nicotine  Substance and Sexual Activity   Alcohol use: Yes    Alcohol/week: 14.0 standard drinks of alcohol    Types: 14 Glasses of wine per week   Drug use: No   Sexual activity: Yes  Other Topics Concern   Not on file  Social History Narrative   Not on file   Social Determinants of Health   Financial Resource Strain: Not on file  Food Insecurity: Not on file  Transportation Needs: Not on file  Physical Activity: Not on file  Stress: Not on file  Social Connections: Not on file  Intimate Partner Violence: Not on file    Physical Exam: Vital signs in last 24 hours: BP 124/60   Pulse 89   Temp 98.6 F (37 C)   Ht 6' 2"$  (1.88 m)   Wt 244 lb (110.7 kg)   SpO2 96%   BMI 31.33 kg/m  GEN: NAD EYE: Sclerae anicteric ENT: MMM CV: Non-tachycardic Pulm: No increased WOB GI: Soft NEURO:  Alert & Oriented   Christia Reading, MD [facility]   12/12/2022 8:46 AM

## 2022-12-12 NOTE — Progress Notes (Signed)
Report to pacu rn. Vss. Care resumed by rn. 

## 2022-12-12 NOTE — Patient Instructions (Addendum)
Await pathology results. The findings and recommendations were discussed with the patient. Return to GI clinic in 4 weeks with Dr. Hilarie Fredrickson or Ellouise Newer for follow up.  Please read over handouts about gastritis and hiatal hernias   YOU HAD AN ENDOSCOPIC PROCEDURE TODAY AT [facility]:   Refer to the procedure report that was given to you for any specific questions about what was found during the examination.  If the procedure report does not answer your questions, please call your gastroenterologist to clarify.  If you requested that your care partner not be given the details of your procedure findings, then the procedure report has been included in a sealed envelope for you to review at your convenience later.  YOU SHOULD EXPECT: Some feelings of bloating in the abdomen. Passage of more gas than usual.    Please Note:  You might notice some irritation and congestion in your nose or some drainage.  This is from the oxygen used during your procedure.  There is no need for concern and it should clear up in a day or so.  SYMPTOMS TO REPORT IMMEDIATELY:   Following upper endoscopy (EGD)  Vomiting of blood or coffee ground material  New chest pain or pain under the shoulder blades  Painful or persistently difficult swallowing  New shortness of breath  Fever of 100F or higher  Black, tarry-looking stools  For urgent or emergent issues, a gastroenterologist can be reached at any hour by calling [phone]. Do not use MyChart messaging for urgent concerns.    DIET:  We do recommend a small meal at first, but then you may proceed to your regular diet.  Drink plenty of fluids but you should avoid alcoholic beverages for 24 hours.  ACTIVITY:  You should plan to take it easy for the rest of today and you should NOT DRIVE or use heavy machinery until tomorrow (because of the sedation medicines used during the test).    FOLLOW UP: Our staff will call the number listed on  your records the next business day following your procedure.  We will call around 7:15- 8:00 am to check on you and address any questions or concerns that you may have regarding the information given to you following your procedure. If we do not reach you, we will leave a message.     If any biopsies were taken you will be contacted by phone or by letter within the next 1-3 weeks.  Please call us at [phone] if you have not heard about the biopsies in 3 weeks.    SIGNATURES/CONFIDENTIALITY: You and/or your care partner have signed paperwork which will be entered into your electronic medical record.  These signatures attest to the fact that that the information above on your After Visit Summary has been reviewed and is understood.  Full responsibility of the confidentiality of this discharge information lies with you and/or your care-partner.

## 2022-12-13 ENCOUNTER — Telehealth: Payer: Self-pay

## 2022-12-13 ENCOUNTER — Other Ambulatory Visit: Payer: Self-pay

## 2022-12-13 NOTE — Telephone Encounter (Signed)
Attempted to reach patient for post-procedure f/u call. No answer. Left message for him to please not hesitate to call if he has any questions/concerns regarding his care.

## 2022-12-17 NOTE — Progress Notes (Signed)
Addendum: Reviewed and agree with assessment and management plan. Diamante Truszkowski M, MD  

## 2022-12-24 ENCOUNTER — Encounter (HOSPITAL_COMMUNITY)
Admission: RE | Admit: 2022-12-24 | Discharge: 2022-12-24 | Disposition: A | Discharge status: Home / Self Care | Payer: Managed Care, Other (non HMO) | Source: Ambulatory Visit | Attending: Internal Medicine | Admitting: Internal Medicine

## 2022-12-24 DIAGNOSIS — R1013 Epigastric pain: Secondary | ICD-10-CM | POA: Insufficient documentation

## 2022-12-24 MED ORDER — TECHNETIUM TC 99M MEBROFENIN IV KIT
5.2000 | PACK | Freq: Once | INTRAVENOUS | Status: AC | PRN
  Administered 2022-12-24: 5.2 via INTRAVENOUS

## 2023-01-08 ENCOUNTER — Encounter: Payer: Self-pay | Admitting: Internal Medicine

## 2023-01-10 ENCOUNTER — Ambulatory Visit: Payer: Managed Care, Other (non HMO) | Admitting: Physician Assistant

## 2023-02-20 ENCOUNTER — Ambulatory Visit: Payer: Managed Care, Other (non HMO) | Admitting: Physician Assistant

## 2024-04-02 ENCOUNTER — Other Ambulatory Visit: Payer: Self-pay | Admitting: Internal Medicine

## 2024-04-02 DIAGNOSIS — R634 Abnormal weight loss: Secondary | ICD-10-CM

## 2024-04-02 DIAGNOSIS — R1010 Upper abdominal pain, unspecified: Secondary | ICD-10-CM

## 2024-04-08 ENCOUNTER — Telehealth: Payer: Self-pay

## 2024-04-08 MED ORDER — PREDNISONE 50 MG PO TABS: ORAL_TABLET | ORAL | 0 refills | Status: DC

## 2024-04-08 MED ORDER — PREDNISONE 50 MG PO TABS: ORAL_TABLET | ORAL | 0 refills | Status: AC

## 2024-04-08 NOTE — Telephone Encounter (Signed)
 Phone call to patient to review instructions for 13 hr prep for CT w/ contrast on 04/10/24  at 2:50PM. Prescription called into [facility]. Pt aware and verbalized understanding of instructions. Prescription:  Pt to take 50 mg of prednisone  on 04/10/24 at 1:50AM, 50 mg of prednisone  on 04/10/24 at 7:50AM, and 50 mg of prednisone  on 04/10/24 at 1:50PM. Pt is also to take 50 mg of benadryl  on 04/10/24 at 1:50PM. Please call [phone] with any questions.

## 2024-04-09 ENCOUNTER — Inpatient Hospital Stay: Admission: RE | Admit: 2024-04-09 | Source: Ambulatory Visit

## 2024-04-10 ENCOUNTER — Ambulatory Visit
Admission: RE | Admit: 2024-04-10 | Discharge: 2024-04-10 | Disposition: A | Discharge status: Home / Self Care | Source: Ambulatory Visit | Attending: Internal Medicine | Admitting: Internal Medicine

## 2024-04-10 DIAGNOSIS — R1010 Upper abdominal pain, unspecified: Secondary | ICD-10-CM

## 2024-04-10 DIAGNOSIS — R634 Abnormal weight loss: Secondary | ICD-10-CM

## 2024-04-10 MED ORDER — IOPAMIDOL (ISOVUE-300) INJECTION 61%
100.0000 mL | Freq: Once | INTRAVENOUS | Status: AC | PRN
  Administered 2024-04-10: 100 mL via INTRAVENOUS

## 2024-07-06 ENCOUNTER — Encounter: Payer: Self-pay | Admitting: Gastroenterology

## 2024-09-26 ENCOUNTER — Encounter: Payer: Self-pay | Admitting: Internal Medicine
